# Patient Record
Sex: Male | Born: 1950 | Race: White | Hispanic: No | Marital: Married | Smoking: Never smoker
Health system: Southern US, Community
[De-identification: ages and names within clinical notes are randomized; demographics above are authoritative.]

## PROBLEM LIST (undated history)

## (undated) DIAGNOSIS — I700 Atherosclerosis of aorta: Secondary | ICD-10-CM

## (undated) DIAGNOSIS — I499 Cardiac arrhythmia, unspecified: Secondary | ICD-10-CM

## (undated) DIAGNOSIS — I10 Essential (primary) hypertension: Secondary | ICD-10-CM

## (undated) DIAGNOSIS — I38 Endocarditis, valve unspecified: Secondary | ICD-10-CM

## (undated) DIAGNOSIS — C801 Malignant (primary) neoplasm, unspecified: Secondary | ICD-10-CM

## (undated) DIAGNOSIS — C61 Malignant neoplasm of prostate: Secondary | ICD-10-CM

## (undated) DIAGNOSIS — Z7901 Long term (current) use of anticoagulants: Secondary | ICD-10-CM

## (undated) DIAGNOSIS — I2510 Atherosclerotic heart disease of native coronary artery without angina pectoris: Secondary | ICD-10-CM

## (undated) DIAGNOSIS — I4891 Unspecified atrial fibrillation: Secondary | ICD-10-CM

## (undated) DIAGNOSIS — I517 Cardiomegaly: Secondary | ICD-10-CM

## (undated) DIAGNOSIS — K769 Liver disease, unspecified: Secondary | ICD-10-CM

## (undated) DIAGNOSIS — K219 Gastro-esophageal reflux disease without esophagitis: Secondary | ICD-10-CM

---

## 2013-05-09 ENCOUNTER — Other Ambulatory Visit: Payer: Self-pay | Admitting: Orthopedic Surgery

## 2013-05-09 LAB — SYNOVIAL CELL COUNT + DIFF, W/ CRYSTALS
Eosinophil: 0 %
Lymphocytes: 47 %
Neutrophils: 5 %
Other Cells BF: 0 %
Other Mononuclear Cells: 48 %

## 2013-05-13 LAB — BODY FLUID CULTURE

## 2017-11-04 ENCOUNTER — Other Ambulatory Visit: Payer: Self-pay | Admitting: Cardiology

## 2017-11-10 ENCOUNTER — Ambulatory Visit
Admission: RE | Admit: 2017-11-10 | Discharge: 2017-11-10 | Disposition: A | Discharge status: Home / Self Care | Payer: BLUE CROSS/BLUE SHIELD | Source: Ambulatory Visit | Attending: Cardiology | Admitting: Cardiology

## 2017-11-10 ENCOUNTER — Ambulatory Visit: Payer: BLUE CROSS/BLUE SHIELD | Admitting: Anesthesiology

## 2017-11-10 ENCOUNTER — Encounter: Payer: Self-pay | Admitting: *Deleted

## 2017-11-10 ENCOUNTER — Encounter
Admission: RE | Disposition: A | Discharge status: Home / Self Care | Payer: Self-pay | Source: Ambulatory Visit | Attending: Cardiology

## 2017-11-10 DIAGNOSIS — G4733 Obstructive sleep apnea (adult) (pediatric): Secondary | ICD-10-CM | POA: Diagnosis not present

## 2017-11-10 DIAGNOSIS — Z7901 Long term (current) use of anticoagulants: Secondary | ICD-10-CM | POA: Diagnosis not present

## 2017-11-10 DIAGNOSIS — I4891 Unspecified atrial fibrillation: Secondary | ICD-10-CM | POA: Diagnosis present

## 2017-11-10 DIAGNOSIS — Z79899 Other long term (current) drug therapy: Secondary | ICD-10-CM | POA: Diagnosis not present

## 2017-11-10 HISTORY — DX: Unspecified atrial fibrillation: I48.91

## 2017-11-10 HISTORY — PX: CARDIOVERSION: EP1203

## 2017-11-10 SURGERY — CARDIOVERSION (CATH LAB)
Anesthesia: General

## 2017-11-10 MED ORDER — SODIUM CHLORIDE 0.9 % IV SOLN
INTRAVENOUS | Status: DC | PRN
  Administered 2017-11-10: 07:00:00 via INTRAVENOUS

## 2017-11-10 MED ORDER — PROPOFOL 10 MG/ML IV BOLUS
INTRAVENOUS | Status: AC
  Filled 2017-11-10: qty 20

## 2017-11-10 MED ORDER — DILTIAZEM HCL 25 MG/5ML IV SOLN
10.0000 mg | Freq: Once | INTRAVENOUS | Status: DC
  Administered 2017-11-10: 10 mg via INTRAVENOUS
  Filled 2017-11-10: qty 5

## 2017-11-10 MED ORDER — PROPOFOL 10 MG/ML IV BOLUS
INTRAVENOUS | Status: DC | PRN
  Administered 2017-11-10: 70 mg via INTRAVENOUS
  Administered 2017-11-10: 30 mg via INTRAVENOUS
  Administered 2017-11-10: 20 mg via INTRAVENOUS
  Administered 2017-11-10: 30 mg via INTRAVENOUS
  Administered 2017-11-10: 50 mg via INTRAVENOUS

## 2017-11-10 MED ORDER — DILTIAZEM LOAD VIA INFUSION
10.0000 mg | Freq: Once | INTRAVENOUS | Status: DC
  Filled 2017-11-10: qty 10

## 2017-11-10 NOTE — Anesthesia Preprocedure Evaluation (Signed)
Anesthesia Evaluation  Patient identified by MRN, date of birth, ID band Patient awake    Reviewed: Allergy & Precautions, H&P , NPO status , Patient's Chart, lab work & pertinent test results  History of Anesthesia Complications Negative for: history of anesthetic complications  Airway Mallampati: III  TM Distance: <3 FB Neck ROM: limited    Dental  (+) Chipped   Pulmonary neg pulmonary ROS, neg shortness of breath,           Cardiovascular Exercise Tolerance: Good (-) angina(-) Past MI and (-) DOE + dysrhythmias Atrial Fibrillation      Neuro/Psych negative neurological ROS  negative psych ROS   GI/Hepatic negative GI ROS, Neg liver ROS, neg GERD  ,  Endo/Other  negative endocrine ROS  Renal/GU negative Renal ROS  negative genitourinary   Musculoskeletal   Abdominal   Peds  Hematology negative hematology ROS (+)   Anesthesia Other Findings Past Medical History: No date: Atrial fibrillation ([place])  History reviewed. No pertinent surgical history.     Reproductive/Obstetrics negative OB ROS                             Anesthesia Physical Anesthesia Plan  ASA: III  Anesthesia Plan: General   Post-op Pain Management:    Induction: Intravenous  PONV Risk Score and Plan: Propofol infusion  Airway Management Planned: Natural Airway and Nasal Cannula  Additional Equipment:   Intra-op Plan:   Post-operative Plan:   Informed Consent: I have reviewed the patients History and Physical, chart, labs and discussed the procedure including the risks, benefits and alternatives for the proposed anesthesia with the patient or authorized representative who has indicated his/her understanding and acceptance.   Dental Advisory Given  Plan Discussed with: Anesthesiologist, CRNA and Surgeon  Anesthesia Plan Comments: (Patient consented for risks of anesthesia including but not limited to:   - adverse reactions to medications - risk of intubation if required - damage to teeth, lips or other oral mucosa - sore throat or hoarseness - Damage to heart, brain, lungs or loss of life  Patient voiced understanding.)        Anesthesia Quick Evaluation

## 2017-11-10 NOTE — Anesthesia Postprocedure Evaluation (Signed)
Anesthesia Post Note  Patient: Edwin [name]  Procedure(s) Performed: CARDIOVERSION (N/A )  Patient location during evaluation: Cath Lab Anesthesia Type: General Level of consciousness: awake and alert Pain management: pain level controlled Vital Signs Assessment: post-procedure vital signs reviewed and stable Respiratory status: spontaneous breathing, nonlabored ventilation, respiratory function stable and patient connected to nasal cannula oxygen Cardiovascular status: blood pressure returned to baseline and stable Postop Assessment: no apparent nausea or vomiting Anesthetic complications: no     Last Vitals:  Vitals:   11/10/17 0830 11/10/17 0845  BP: 140/90 135/88  Pulse: 97 (!) 102  Resp: 14 14  Temp:    SpO2: 98% 97%    Last Pain: There were no vitals filed for this visit.               Precious Haws Piscitello

## 2017-11-10 NOTE — Anesthesia Post-op Follow-up Note (Signed)
Anesthesia QCDR form completed.        

## 2017-11-10 NOTE — Transfer of Care (Signed)
Immediate Anesthesia Transfer of Care Note  Patient: Edwin [name]  Procedure(s) Performed: CARDIOVERSION (N/A )  Patient Location: spu  Anesthesia Type:General  Level of Consciousness: awake and alert   Airway & Oxygen Therapy: Patient Spontanous Breathing and Patient connected to nasal cannula oxygen  Post-op Assessment: Report given to RN and Post -op Vital signs reviewed and stable  Post vital signs: Reviewed  Last Vitals:  Vitals:   11/10/17 0750 11/10/17 0751  BP:  (!) 128/100  Pulse: (!) 129 (!) 130  Resp: 18 15  Temp:    SpO2: 94% 96%    Last Pain: There were no vitals filed for this visit.       Complications: No apparent anesthesia complications

## 2017-11-10 NOTE — Procedures (Signed)
Electrical Cardioversion Procedure Note Edwin [name] [id number] [birth year]  Procedure: Electrical Cardioversion Indications:  Atrial Fibrillation  Procedure Details Consent: Risks of procedure as well as the alternatives and risks of each were explained to the (patient/caregiver).  Consent for procedure obtained. Time Out: Verified patient identification, verified procedure, site/side was marked, verified correct patient position, special equipment/implants available, medications/allergies/relevent history reviewed, required imaging and test results available.  Performed  Patient placed on cardiac monitor, pulse oximetry, supplemental oxygen as necessary.  Sedation given: propofol Pacer pads placed anterior and posterior chest.  Cardioverted 3 time(s).  Cardioverted at [place].  Evaluation Findings: Post procedure EKG shows: Atrial Flutter Complications: None Patient did tolerate procedure well.   Teodoro Spray 11/10/2017, 8:05 AM

## 2017-11-26 NOTE — H&P (Signed)
Chief Complaint: Chief Complaint  Patient presents with  . 4 week follow up  afib  Date of Service: 10/29/2017 Date of Birth: 1951-07-10 PCP: Albina Billet, MD  History of Present Illness: Edwin [name] is a 66 y.o.male patient who is referred for evaluation after being noted to have probable atrial fibrillation on a physical exam. Patient was noted to get somewhat fatigued while hiking. This was new for him. His chads score is 1 for hypertension. His rate is well controlled. He has some daytime somnolence. He denies syncope. He denies chest pain. Patient was anticoagulated with apixaban and rate control with diltiazem. He remains in atrial fibrillation. He presents now for consideration for alternative therapy to consider cardioversion versus ablation. EKG today reveals atrial fibrillation at a rate of 112 with a QRS duration of 84 ms and a QTC of 458 ms. There is no ischemia.  Past Medical and Surgical History  Past Medical History History reviewed. No pertinent past medical history.  Past Surgical History He has no past surgical history on file.   Medications and Allergies  Current Medications  Current Outpatient Medications  Medication Sig Dispense Refill  . apixaban (ELIQUIS) 5 mg tablet Take 1 tablet (5 mg total) by mouth 2 (two) times daily 60 tablet 11  . diltiazem (CARDIZEM CD) 180 MG CD capsule Take 1 capsule (180 mg total) by mouth once daily 30 capsule 11  . doxazosin (CARDURA) 8 MG tablet Take one take daily 0  . losartan (COZAAR) 100 MG tablet Take 100 mg by mouth once daily. 5   No current facility-administered medications for this visit.   Allergies: Patient has no allergy information on record.  Social and Family History  Social History reports that he has never smoked. He has never used smokeless tobacco. He reports that he drinks about 25.2 oz of alcohol per week. He reports that he does not use drugs.  Family History History reviewed. No pertinent family  history.  Review of Systems  Review of Systems  Constitutional: Negative for chills, diaphoresis, fever, malaise/fatigue and weight loss.  HENT: Negative for congestion, ear discharge, hearing loss and tinnitus.  Eyes: Negative for blurred vision.  Respiratory: Negative for cough, hemoptysis, sputum production and wheezing.  Cardiovascular: Negative for chest pain, orthopnea, claudication, leg swelling and PND.  Gastrointestinal: Negative for abdominal pain, blood in stool, constipation, diarrhea, heartburn, melena, nausea and vomiting.  Genitourinary: Negative for dysuria, frequency, hematuria and urgency.  Musculoskeletal: Negative for back pain, falls, joint pain and myalgias.  Skin: Negative for itching and rash.  Neurological: Negative for dizziness, tingling, focal weakness, loss of consciousness, weakness and headaches.  Endo/Heme/Allergies: Negative for polydipsia. Does not bruise/bleed easily.  Psychiatric/Behavioral: Negative for depression, memory loss and substance abuse. The patient is not nervous/anxious.   Physical Examination   Vitals:BP 128/70  Resp 12  Ht 186.7 cm (6' 1.5")  Wt (!) 108 kg (238 lb)  BMI 30.97 kg/m  Ht:186.7 cm (6' 1.5") Wt:(!) 108 kg (238 lb) BTD:VVOH surface area is 2.37 meters squared. Body mass index is 30.97 kg/m.  Wt Readings from Last 3 Encounters:  10/29/17 (!) 108 kg (238 lb)  10/01/17 (!) 106.6 kg (235 lb)  09/09/17 (!) 105.7 kg (233 lb)   BP Readings from Last 3 Encounters:  10/29/17 128/70  10/01/17 130/84  09/09/17 142/80   General appearance appears in no acute distress  Head Mouth and Eye exam Normocephalic, without obvious abnormality, atraumatic Dentition is good Eyes appear anicteric  Neck exam Thyroid: normal  Nodes: no obvious adenopathy  LUNGS Breath Sounds: Normal Percussion: Normal  CARDIOVASCULAR JVP CV wave: no HJR: no Elevation at 90 degrees: None Carotid Pulse: normal pulsation  bilaterally Bruit: None Apex: apical impulse normal  Auscultation Rhythm: atrial fibrillation S1: normal S2: normal Clicks: no Rub: no Murmurs: no murmurs  Gallop: None ABDOMEN Liver enlargement: no Pulsatile aorta: no Ascites: no Bruits: no  EXTREMITIES Clubbing: no Edema: trace to 1+ bilateral pedal edema Pulses: peripheral pulses symmetrical Femoral Bruits: no Amputation: no SKIN Rash: no Cyanosis: no Embolic phemonenon: no Bruising: no NEURO Alert and Oriented to person, place and time: yes Non focal: yes  PSYCH: Pt appears to have normal affect  Diagnostic Studies Reviewed:  EKG EKG demonstrated nonspecific ST and T waves changes, atrial fibrillation, rate 112.  Assessment and Plan   66 y.o. male with  ICD-10-CM ICD-9-CM  1. Atrial fibrillation, unspecified type (CMS-[name])-rate appears to be fairly well controlled. Holter showed controlled rate. Chads score is 1. Will continue with Eliquis at 5 mg twice daily and continue with diltiazem at 180 mg daily. Benefits of cardioversion. Will schedule elective cardioversion with further recommendations after this is complete. I48.91 427.31      2. Obstructive sleep apnea syndrome-consideration for sleep study was raised however patient symptoms have improved. Will defer sleep study for now. G47.33 327.23   Return in about 1 week (around 11/05/2017).  These notes generated with voice recognition software. I apologize for typographical errors.  Edwin Levans, MD     Pt seen and examined. No change from above.

## 2018-05-26 ENCOUNTER — Other Ambulatory Visit: Payer: Self-pay | Admitting: Cardiology

## 2018-05-27 ENCOUNTER — Encounter
Admission: RE | Disposition: A | Discharge status: Home / Self Care | Payer: Self-pay | Source: Ambulatory Visit | Attending: Cardiology

## 2018-05-27 ENCOUNTER — Ambulatory Visit: Payer: Medicare Other | Admitting: Certified Registered Nurse Anesthetist

## 2018-05-27 ENCOUNTER — Encounter: Payer: Self-pay | Admitting: *Deleted

## 2018-05-27 ENCOUNTER — Ambulatory Visit
Admission: RE | Admit: 2018-05-27 | Discharge: 2018-05-27 | Disposition: A | Discharge status: Home / Self Care | Payer: Medicare Other | Source: Ambulatory Visit | Attending: Cardiology | Admitting: Cardiology

## 2018-05-27 DIAGNOSIS — Z7901 Long term (current) use of anticoagulants: Secondary | ICD-10-CM | POA: Diagnosis not present

## 2018-05-27 DIAGNOSIS — Z79899 Other long term (current) drug therapy: Secondary | ICD-10-CM | POA: Insufficient documentation

## 2018-05-27 DIAGNOSIS — G4733 Obstructive sleep apnea (adult) (pediatric): Secondary | ICD-10-CM | POA: Insufficient documentation

## 2018-05-27 DIAGNOSIS — I4891 Unspecified atrial fibrillation: Secondary | ICD-10-CM | POA: Insufficient documentation

## 2018-05-27 HISTORY — PX: CARDIOVERSION: EP1203

## 2018-05-27 SURGERY — CARDIOVERSION (CATH LAB)
Anesthesia: General

## 2018-05-27 MED ORDER — SODIUM CHLORIDE 0.9 % IV SOLN
250.0000 mL | INTRAVENOUS | Status: DC
  Administered 2018-05-27: 07:00:00 via INTRAVENOUS

## 2018-05-27 MED ORDER — SODIUM CHLORIDE 0.9% FLUSH: 3.0000 mL | INTRAVENOUS | Status: DC | PRN

## 2018-05-27 MED ORDER — PROPOFOL 10 MG/ML IV BOLUS
INTRAVENOUS | Status: DC | PRN
  Administered 2018-05-27 (×2): 30 mg via INTRAVENOUS
  Administered 2018-05-27: 10 mg via INTRAVENOUS
  Administered 2018-05-27: 60 mg via INTRAVENOUS

## 2018-05-27 MED ORDER — HYDROCORTISONE 1 % EX CREA
1.0000 "application " | TOPICAL_CREAM | Freq: Three times a day (TID) | CUTANEOUS | Status: DC | PRN
  Filled 2018-05-27: qty 28

## 2018-05-27 MED ORDER — EPHEDRINE SULFATE 50 MG/ML IJ SOLN
INTRAMUSCULAR | Status: AC
  Filled 2018-05-27: qty 1

## 2018-05-27 MED ORDER — SODIUM CHLORIDE 0.9% FLUSH: 3.0000 mL | Freq: Two times a day (BID) | INTRAVENOUS | Status: DC

## 2018-05-27 MED ORDER — PROPOFOL 10 MG/ML IV BOLUS
INTRAVENOUS | Status: AC
  Filled 2018-05-27: qty 20

## 2018-05-27 NOTE — Anesthesia Preprocedure Evaluation (Signed)
Anesthesia Evaluation  Patient identified by MRN, date of birth, ID band Patient awake    Reviewed: Allergy & Precautions, H&P , NPO status , Patient's Chart, lab work & pertinent test results  History of Anesthesia Complications Negative for: history of anesthetic complications  Airway Mallampati: III  TM Distance: <3 FB Neck ROM: limited    Dental  (+) Chipped   Pulmonary neg pulmonary ROS, neg shortness of breath,           Cardiovascular Exercise Tolerance: Good (-) angina(-) Past MI and (-) DOE + dysrhythmias Atrial Fibrillation      Neuro/Psych negative neurological ROS  negative psych ROS   GI/Hepatic negative GI ROS, Neg liver ROS, neg GERD  ,  Endo/Other  negative endocrine ROS  Renal/GU negative Renal ROS  negative genitourinary   Musculoskeletal   Abdominal   Peds  Hematology negative hematology ROS (+)   Anesthesia Other Findings Past Medical History: No date: Atrial fibrillation ([place])  History reviewed. No pertinent surgical history.     Reproductive/Obstetrics negative OB ROS                             Anesthesia Physical  Anesthesia Plan  ASA: III  Anesthesia Plan: General   Post-op Pain Management:    Induction: Intravenous  PONV Risk Score and Plan: Propofol infusion and TIVA  Airway Management Planned: Natural Airway and Nasal Cannula  Additional Equipment:   Intra-op Plan:   Post-operative Plan:   Informed Consent: I have reviewed the patients History and Physical, chart, labs and discussed the procedure including the risks, benefits and alternatives for the proposed anesthesia with the patient or authorized representative who has indicated his/her understanding and acceptance.   Dental Advisory Given  Plan Discussed with: Anesthesiologist, CRNA and Surgeon  Anesthesia Plan Comments: (Patient consented for risks of anesthesia including but not  limited to:  - adverse reactions to medications - risk of intubation if required - damage to teeth, lips or other oral mucosa - sore throat or hoarseness - Damage to heart, brain, lungs or loss of life  Patient voiced understanding.)        Anesthesia Quick Evaluation                                  Anesthesia Evaluation  Patient identified by MRN, date of birth, ID band Patient awake    Reviewed: Allergy & Precautions, H&P , NPO status , Patient's Chart, lab work & pertinent test results  History of Anesthesia Complications Negative for: history of anesthetic complications  Airway Mallampati: III  TM Distance: <3 FB Neck ROM: limited    Dental  (+) Chipped   Pulmonary neg pulmonary ROS, neg shortness of breath,           Cardiovascular Exercise Tolerance: Good (-) angina(-) Past MI and (-) DOE + dysrhythmias Atrial Fibrillation      Neuro/Psych negative neurological ROS  negative psych ROS   GI/Hepatic negative GI ROS, Neg liver ROS, neg GERD  ,  Endo/Other  negative endocrine ROS  Renal/GU negative Renal ROS  negative genitourinary   Musculoskeletal   Abdominal   Peds  Hematology negative hematology ROS (+)   Anesthesia Other Findings Past Medical History: No date: Atrial fibrillation ([place])  History reviewed. No pertinent surgical history.     Reproductive/Obstetrics negative OB ROS  Anesthesia Physical Anesthesia Plan  ASA: III  Anesthesia Plan: General   Post-op Pain Management:    Induction: Intravenous  PONV Risk Score and Plan: Propofol infusion  Airway Management Planned: Natural Airway and Nasal Cannula  Additional Equipment:   Intra-op Plan:   Post-operative Plan:   Informed Consent: I have reviewed the patients History and Physical, chart, labs and discussed the procedure including the risks, benefits and alternatives for the proposed anesthesia with  the patient or authorized representative who has indicated his/her understanding and acceptance.   Dental Advisory Given  Plan Discussed with: Anesthesiologist, CRNA and Surgeon  Anesthesia Plan Comments: (Patient consented for risks of anesthesia including but not limited to:  - adverse reactions to medications - risk of intubation if required - damage to teeth, lips or other oral mucosa - sore throat or hoarseness - Damage to heart, brain, lungs or loss of life  Patient voiced understanding.)        Anesthesia Quick Evaluation

## 2018-05-27 NOTE — Transfer of Care (Signed)
Immediate Anesthesia Transfer of Care Note  Patient: Edwin [name]  Procedure(s) Performed: CARDIOVERSION (N/A )  Patient Location: PACU  Anesthesia Type:General  Level of Consciousness: awake, alert  and oriented  Airway & Oxygen Therapy: Patient Spontanous Breathing and Patient connected to nasal cannula oxygen  Post-op Assessment: Report given to RN and Post -op Vital signs reviewed and stable  Post vital signs: Reviewed and stable  Last Vitals:  Vitals Value Taken Time  BP 132/82 05/27/2018  7:42 AM  Temp    Pulse 78 05/27/2018  7:43 AM  Resp 22 05/27/2018  7:43 AM  SpO2 93 % 05/27/2018  7:43 AM    Last Pain:  Vitals:   05/27/18 0646  PainSc: 0-No pain         Complications: No apparent anesthesia complications

## 2018-05-27 NOTE — Anesthesia Procedure Notes (Signed)
Performed by: Leshon Armistead, CRNA Pre-anesthesia Checklist: Patient identified, Emergency Drugs available, Suction available, Patient being monitored and Timeout performed Oxygen Delivery Method: Nasal cannula Induction Type: IV induction       

## 2018-05-27 NOTE — H&P (Signed)
Chief Complaint: Chief Complaint  Patient presents with  . Follow-up  Date of Service: 05/13/2018 Date of Birth: 1951/07/14 PCP: Albina Billet, MD  History of Present Illness: Mr. Edwin [name] is a 67 y.o.male patient who is referred for evaluation after being noted to have probable atrial fibrillation on a physical exam. Patient underwent cardioversion attempts with Cardizem and apixaban. He did not convert to sinus rhythm. He now returns for follow-up visit. Continues to have some weakness and fatigue. Patient is tolerating flecainide well but remains in A. fib. Will increase to 100 mg daily and reattempt cardioversion. He is back on Eliquis and doing well. Past Medical and Surgical History  Past Medical History History reviewed. No pertinent past medical history.  Past Surgical History He has no past surgical history on file.   Medications and Allergies  Current Medications  Current Outpatient Medications  Medication Sig Dispense Refill  . apixaban (ELIQUIS) 5 mg tablet Take 1 tablet (5 mg total) by mouth 2 (two) times daily 60 tablet 11  . calcium carbonate 500 mg calcium (1,250 mg) chewable tablet Take 500 mg of elemental by mouth 2 (two) times daily with meals  . diltiazem (CARDIZEM CD) 240 MG CD capsule Take 1 capsule (240 mg total) by mouth once daily 30 capsule 11  . doxazosin (CARDURA) 8 MG tablet Take one take daily 0  . flecainide (TAMBOCOR) 100 MG tablet Take 1 tablet (100 mg total) by mouth 2 (two) times daily 60 tablet 11  . losartan (COZAAR) 100 MG tablet Take 100 mg by mouth once daily. 5   No current facility-administered medications for this visit.   Allergies: Patient has no known allergies.  Social and Family History  Social History reports that he has never smoked. He has never used smokeless tobacco. He reports that he drinks about 25.2 oz of alcohol per week. He reports that he does not use drugs.  Family History History reviewed. No pertinent family  history.  Review of Systems  Review of Systems  Constitutional: Negative for chills, diaphoresis, fever, malaise/fatigue and weight loss.  HENT: Negative for congestion, ear discharge, hearing loss and tinnitus.  Eyes: Negative for blurred vision.  Respiratory: Negative for cough, hemoptysis, sputum production and wheezing.  Cardiovascular: Negative for chest pain, orthopnea, claudication, leg swelling and PND.  Gastrointestinal: Negative for abdominal pain, blood in stool, constipation, diarrhea, heartburn, melena, nausea and vomiting.  Genitourinary: Negative for dysuria, frequency, hematuria and urgency.  Musculoskeletal: Negative for back pain, falls, joint pain and myalgias.  Skin: Negative for itching and rash.  Neurological: Negative for dizziness, tingling, focal weakness, loss of consciousness, weakness and headaches.  Endo/Heme/Allergies: Negative for polydipsia. Does not bruise/bleed easily.  Psychiatric/Behavioral: Negative for depression, memory loss and substance abuse. The patient is not nervous/anxious.   Physical Examination   Vitals:BP 138/78  Pulse (!) 138  Ht 186.7 cm (6' 1.5")  Wt (!) 103.4 kg (228 lb)  BMI 29.67 kg/m  Ht:186.7 cm (6' 1.5") Wt:(!) 103.4 kg (228 lb) RWE:RXVQ surface area is 2.32 meters squared. Body mass index is 29.67 kg/m.  Wt Readings from Last 3 Encounters:  05/13/18 (!) 103.4 kg (228 lb)  11/18/17 (!) 106.6 kg (235 lb)  10/29/17 (!) 108 kg (238 lb)   BP Readings from Last 3 Encounters:  05/13/18 138/78  11/18/17 140/86  10/29/17 128/70   General appearance appears in no acute distress  Head Mouth and Eye exam Normocephalic, without obvious abnormality, atraumatic Dentition is good Eyes appear  anicteric   Neck exam Thyroid: normal  Nodes: no obvious adenopathy  LUNGS Breath Sounds: Normal Percussion: Normal  CARDIOVASCULAR JVP CV wave: no HJR: no Elevation at 90 degrees: None Carotid Pulse: normal pulsation  bilaterally Bruit: None Apex: apical impulse normal  Auscultation Rhythm: atrial fibrillation S1: normal S2: normal Clicks: no Rub: no Murmurs: no murmurs  Gallop: None ABDOMEN Liver enlargement: no Pulsatile aorta: no Ascites: no Bruits: no  EXTREMITIES Clubbing: no Edema: trace to 1+ bilateral pedal edema Pulses: peripheral pulses symmetrical Femoral Bruits: no Amputation: no SKIN Rash: no Cyanosis: no Embolic phemonenon: no Bruising: no NEURO Alert and Oriented to person, place and time: yes Non focal: yes  PSYCH: Pt appears to have normal affect  Diagnostic Studies Reviewed:  EKG EKG demonstrated nonspecific ST and T waves changes, atrial fibrillation, rate 112.  Assessment and Plan   67 y.o. male with  ICD-10-CM ICD-9-CM  1. Atrial fibrillation, unspecified type (CMS-[name])-rate appears to be fairly well controlled. Did not convert with cardioversion. Chads score is 1. Will continue with Eliquis at 5 mg twice daily and continue with diltiazem at 180 mg daily. We will increase flecainide to 100 mg twice daily and schedule cardioversion in 2 weeks. I48.91 427.31      2. Obstructive sleep apnea syndrome-consideration for sleep study was raised however patient symptoms have improved. Will defer sleep study for now. G47.33 327.23   Return in about 1 month (around 06/10/2018).  These notes generated with voice recognition software. I apologize for typographical errors.  Sydnee Levans, MD    Pt seen and examined. No change from above.

## 2018-05-27 NOTE — Anesthesia Postprocedure Evaluation (Signed)
Anesthesia Post Note  Patient: Edwin [name]  Procedure(s) Performed: CARDIOVERSION (N/A )  Patient location during evaluation: PACU Anesthesia Type: General Level of consciousness: awake and alert and oriented Pain management: pain level controlled Vital Signs Assessment: post-procedure vital signs reviewed and stable Respiratory status: spontaneous breathing Cardiovascular status: blood pressure returned to baseline Anesthetic complications: no     Last Vitals:  Vitals:   05/27/18 0815 05/27/18 0830  BP: 130/88 131/83  Pulse: 74 74  Resp: 15 16  Temp:    SpO2: 97% 97%    Last Pain:  Vitals:   05/27/18 0830  PainSc: 0-No pain                 Toiya Morrish

## 2018-05-27 NOTE — Procedures (Signed)
Electrical Cardioversion Procedure Note Edwin [name] [id number] 1950-12-13  Procedure: Electrical Cardioversion Indications:  Atrial Fibrillation  Procedure Details Consent: Risks of procedure as well as the alternatives and risks of each were explained to the (patient/caregiver).  Consent for procedure obtained. Time Out: Verified patient identification, verified procedure, site/side was marked, verified correct patient position, special equipment/implants available, medications/allergies/relevent history reviewed, required imaging and test results available.  Performed  Patient placed on cardiac monitor, pulse oximetry, supplemental oxygen as necessary.  Sedation given: Propofol Pacer pads placed anterior and posterior chest.  Cardioverted 1 time(s).  Cardioverted at 120J.  Evaluation Findings: Post procedure EKG shows: NSR Complications: None Patient did tolerate procedure well.   Teodoro Spray 05/27/2018, 7:45 AM

## 2018-05-27 NOTE — Anesthesia Post-op Follow-up Note (Signed)
Anesthesia QCDR form completed.        

## 2020-01-05 ENCOUNTER — Ambulatory Visit: Payer: Medicare Other | Attending: Internal Medicine

## 2020-01-05 DIAGNOSIS — Z23 Encounter for immunization: Secondary | ICD-10-CM | POA: Insufficient documentation

## 2020-01-05 NOTE — Progress Notes (Signed)
   Covid-19 Vaccination Clinic  Name:  Edwin [name]    MRN: [id number] DOB: 1951/06/09  01/05/2020  Edwin [name] was observed post Covid-19 immunization for 15 minutes without incidence. He was provided with Vaccine Information Sheet and instruction to access the V-Safe system.   Edwin [name] was instructed to call 911 with any severe reactions post vaccine: Marland Kitchen Difficulty breathing  . Swelling of your face and throat  . A fast heartbeat  . A bad rash all over your body  . Dizziness and weakness    Immunizations Administered    Name Date Dose VIS Date Route   Pfizer COVID-19 Vaccine 01/05/2020 12:10 PM 0.3 mL 11/10/2019 Intramuscular   Manufacturer: [place]   Lot: [id number]   [place]: [id number]

## 2020-01-30 ENCOUNTER — Ambulatory Visit: Payer: Medicare Other | Attending: Internal Medicine

## 2020-01-30 DIAGNOSIS — Z23 Encounter for immunization: Secondary | ICD-10-CM | POA: Insufficient documentation

## 2020-01-30 NOTE — Progress Notes (Signed)
   Covid-19 Vaccination Clinic  Name:  Edwin [name]    MRN: [id number] DOB: 09-07-1951  01/30/2020  Edwin [name] was observed post Covid-19 immunization for 15 minutes without incident. He was provided with Vaccine Information Sheet and instruction to access the V-Safe system.   Edwin [name] was instructed to call 911 with any severe reactions post vaccine: Marland Kitchen Difficulty breathing  . Swelling of face and throat  . A fast heartbeat  . A bad rash all over body  . Dizziness and weakness   Immunizations Administered    Name Date Dose VIS Date Route   Pfizer COVID-19 Vaccine 01/30/2020 11:41 AM 0.3 mL 11/10/2019 Intramuscular   Manufacturer: [place]   Lot: [id number]   [place]: [id number]

## 2020-03-17 ENCOUNTER — Emergency Department
Admission: EM | Admit: 2020-03-17 | Discharge: 2020-03-17 | Disposition: A | Discharge status: Home / Self Care | Payer: Medicare Other | Attending: Emergency Medicine | Admitting: Emergency Medicine

## 2020-03-17 ENCOUNTER — Emergency Department: Payer: Medicare Other

## 2020-03-17 ENCOUNTER — Other Ambulatory Visit: Payer: Self-pay

## 2020-03-17 ENCOUNTER — Encounter: Payer: Self-pay | Admitting: Emergency Medicine

## 2020-03-17 DIAGNOSIS — I10 Essential (primary) hypertension: Secondary | ICD-10-CM | POA: Diagnosis not present

## 2020-03-17 DIAGNOSIS — Z7901 Long term (current) use of anticoagulants: Secondary | ICD-10-CM | POA: Diagnosis not present

## 2020-03-17 DIAGNOSIS — R6883 Chills (without fever): Secondary | ICD-10-CM | POA: Insufficient documentation

## 2020-03-17 DIAGNOSIS — R42 Dizziness and giddiness: Secondary | ICD-10-CM | POA: Diagnosis present

## 2020-03-17 DIAGNOSIS — Z20822 Contact with and (suspected) exposure to covid-19: Secondary | ICD-10-CM | POA: Diagnosis not present

## 2020-03-17 DIAGNOSIS — I4891 Unspecified atrial fibrillation: Secondary | ICD-10-CM | POA: Diagnosis not present

## 2020-03-17 DIAGNOSIS — R5383 Other fatigue: Secondary | ICD-10-CM | POA: Insufficient documentation

## 2020-03-17 DIAGNOSIS — M7918 Myalgia, other site: Secondary | ICD-10-CM | POA: Diagnosis not present

## 2020-03-17 DIAGNOSIS — Z79899 Other long term (current) drug therapy: Secondary | ICD-10-CM | POA: Insufficient documentation

## 2020-03-17 HISTORY — DX: Essential (primary) hypertension: I10

## 2020-03-17 LAB — BASIC METABOLIC PANEL
Anion gap: 8 (ref 5–15)
BUN: 12 mg/dL (ref 8–23)
CO2: 26 mmol/L (ref 22–32)
Calcium: 9 mg/dL (ref 8.9–10.3)
Chloride: 94 mmol/L — ABNORMAL LOW (ref 98–111)
Creatinine, Ser: 1.2 mg/dL (ref 0.61–1.24)
GFR calc Af Amer: >60 mL/min (ref >60)
GFR calc non Af Amer: >60 mL/min (ref >60)
Glucose, Bld: 132 mg/dL — ABNORMAL HIGH (ref 70–99)
Potassium: 4 mmol/L (ref 3.5–5.1)
Sodium: 128 mmol/L — ABNORMAL LOW (ref 135–145)

## 2020-03-17 LAB — CBC
HCT: 36 % — ABNORMAL LOW (ref 39.0–52.0)
Hemoglobin: 12.5 g/dL — ABNORMAL LOW (ref 13.0–17.0)
MCH: 33.7 pg (ref 26.0–34.0)
MCHC: 34.7 g/dL (ref 30.0–36.0)
MCV: 97 fL (ref 80.0–100.0)
Platelets: 161 10*3/uL (ref 150–400)
RBC: 3.71 MIL/uL — ABNORMAL LOW (ref 4.22–5.81)
RDW: 12.1 % (ref 11.5–15.5)
WBC: 4.2 10*3/uL (ref 4.0–10.5)
nRBC: 0 % (ref 0.0–0.2)

## 2020-03-17 LAB — POC SARS CORONAVIRUS 2 AG: SARS Coronavirus 2 Ag: NEGATIVE

## 2020-03-17 MED ORDER — SODIUM CHLORIDE 0.9 % IV SOLN
1000.0000 mL | Freq: Once | INTRAVENOUS | Status: AC
  Administered 2020-03-17: 1000 mL via INTRAVENOUS

## 2020-03-17 MED ORDER — SODIUM CHLORIDE 0.9% FLUSH: 3.0000 mL | Freq: Once | INTRAVENOUS | Status: DC

## 2020-03-17 MED ORDER — KETOROLAC TROMETHAMINE 30 MG/ML IJ SOLN
15.0000 mg | Freq: Once | INTRAMUSCULAR | Status: AC
  Administered 2020-03-17: 20:00:00 15 mg via INTRAVENOUS
  Filled 2020-03-17: qty 1

## 2020-03-17 NOTE — ED Notes (Signed)
Given sprite.

## 2020-03-17 NOTE — ED Triage Notes (Signed)
Pt to ED via POV c/o dizziness. Pt states that about 1 hour PTA he was in the shower and fell. Pt denies LOC. Pt did not hit his head. Pt states that he started leaning and could not catch himself. Pt states that he has felt dizzy for the last 2 days. Pt states that he has also had decreased appetite. Pt denies sensation of the room spinning. Pt states that it is more of a lightheaded feeling. Pt states that he has had chills for 2-3 denies but denies fever at home.

## 2020-03-17 NOTE — ED Notes (Signed)
EDP in with patient 

## 2020-03-17 NOTE — ED Triage Notes (Signed)
FIRST NURSE NOTE- pt has had dizziness since yesterday and fell towards left side twice. Equal grip.  Still feeling dizzy.  Decreased appetite. NAD at this time.

## 2020-03-17 NOTE — ED Provider Notes (Signed)
[facility] Emergency Department Provider Note   ____________________________________________    I have reviewed the triage vital signs and the nursing notes.   HISTORY  Chief Complaint Dizziness     HPI Edwin [name] is a 69 y.o. male with a history of atrial fibrillation on Eliquis who presents with complaints of dizziness.  Patient reports that he was feeling lightheaded today, has felt fatigued as well as had body aches.  Does report chills is not sure if he has had a fever.  He was fully vaccinated last month against COVID-19.  No new medications.  Has not take anything for this.  Reports he is feeling improved at this time.  Denies shortness of breath.  No cough.  No chest pain or palpitations.  Has never felt this way before.  No headache.  No neuro deficits.  No nausea or vomiting  Past Medical History:  Diagnosis Date  . Atrial fibrillation ([place])   . Hypertension     There are no problems to display for this patient.   Past Surgical History:  Procedure Laterality Date  . CARDIOVERSION N/A 11/10/2017   Procedure: CARDIOVERSION;  Surgeon: Teodoro Spray, MD;  Location: [name];  Service: Cardiovascular;  Laterality: N/A;  . CARDIOVERSION N/A 05/27/2018   Procedure: CARDIOVERSION;  Surgeon: Teodoro Spray, MD;  Location: [name];  Service: Cardiovascular;  Laterality: N/A;    Prior to Admission medications   Medication Sig Start Date End Date Taking? Authorizing Provider  calcium carbonate (TUMS - DOSED IN MG ELEMENTAL CALCIUM) 500 MG chewable tablet Chew 1-2 tablets by mouth 2 (two) times daily as needed for indigestion or heartburn.    [provider]  diltiazem (CARDIZEM CD) 240 MG 24 hr capsule Take 240 mg by mouth daily.  10/27/17   [provider]  doxazosin (CARDURA) 8 MG tablet Take 8 mg by mouth every evening.    [provider]  ELIQUIS 5 MG TABS tablet Take 5 mg by mouth 2 (two) times daily. 10/29/17    [provider]  flecainide (TAMBOCOR) 100 MG tablet Take 100 mg by mouth 2 (two) times daily.    [provider]  losartan (COZAAR) 100 MG tablet Take 100 mg by mouth daily.    [provider]     Allergies Patient has no known allergies.  No family history on file.  Social History Social History   Tobacco Use  . Smoking status: Never Smoker  . Smokeless tobacco: Never Used  Substance Use Topics  . Alcohol use: Yes    Comment: 6-8 beers daily  . Drug use: No    Review of Systems  Constitutional: No fever/chills Eyes: No visual changes.  ENT: No sore throat. Cardiovascular: Denies chest pain. Respiratory: Denies shortness of breath. Gastrointestinal: No abdominal pain.  No nausea, no vomiting.   Genitourinary: Negative for dysuria. Musculoskeletal: Negative for back pain. Skin: Negative for rash. Neurological: Negative for headaches    ____________________________________________   PHYSICAL EXAM:  VITAL SIGNS: ED Triage Vitals [03/17/20 1619]  Enc Vitals Group     BP (!) 110/50     Pulse Rate 60     Resp 16     Temp 100.3 F (37.9 C)     Temp Source Oral     SpO2 97 %     Weight 93.4 kg (206 lb)     Height 1.854 m (6\' 1" )     Head Circumference  Peak Flow      Pain Score 0     Pain Loc      Pain Edu?      Excl. in [place]?     Constitutional: Alert and oriented. No acute distress.   Nose: No congestion/rhinnorhea. Mouth/Throat: Mucous membranes are moist.    Cardiovascular: Normal rate, regular rhythm. Grossly normal heart sounds.  Good peripheral circulation. Respiratory: Normal respiratory effort.  No retractions. Lungs CTAB. Gastrointestinal: Soft and nontender. No distention.   Genitourinary: deferred Musculoskeletal: No lower extremity tenderness nor edema.  Warm and well perfused Neurologic:  Normal speech and language. No gross focal neurologic deficits are appreciated.  Ambulating without difficulty.  No neuro  deficits noted Skin:  Skin is warm, dry and intact. No rash noted. Psychiatric: Mood and affect are normal. Speech and behavior are normal.  ____________________________________________   LABS (all labs ordered are listed, but only abnormal results are displayed)  Labs Reviewed  BASIC METABOLIC PANEL - Abnormal; Notable for the following components:      Result Value   Sodium 128 (*)    Chloride 94 (*)    Glucose, Bld 132 (*)    All other components within normal limits  CBC - Abnormal; Notable for the following components:   RBC 3.71 (*)    Hemoglobin 12.5 (*)    HCT 36.0 (*)    All other components within normal limits  SARS CORONAVIRUS 2 (TAT 6-24 HRS)  URINALYSIS, COMPLETE (UACMP) WITH MICROSCOPIC  POC SARS CORONAVIRUS 2 AG -  ED  POC SARS CORONAVIRUS 2 AG   ____________________________________________  EKG  ED ECG REPORT I, Lavonia Drafts, the attending physician, personally viewed and interpreted this ECG.  Date: 03/17/2020  Rhythm: normal sinus rhythm QRS Axis: normal Intervals: normal ST/T Wave abnormalities: normal Narrative Interpretation: no evidence of acute ischemia  ____________________________________________  RADIOLOGY  Chest x-ray viewed by me, unremarkable ____________________________________________   PROCEDURES  Procedure(s) performed: No  Procedures   Critical Care performed: No ____________________________________________   INITIAL IMPRESSION / ASSESSMENT AND PLAN / ED COURSE  Pertinent labs & imaging results that were available during my care of the patient were reviewed by me and considered in my medical decision making (see chart for details).  Patient presents with complaints of lightheadedness today, now significantly improved.  Does report chills as well as body aches.  No loss of taste or smell but decreased appetite.  Has not had much to eat in over a day.  No sick contacts reported.  No nausea or vomiting.  No neuro  deficits or headache.  No palpitations or chest pain to suggest arrhythmia.  No cough or shortness of breath.  Symptoms are suspicious for possible infection, possible viral illness given elevated temperature of 100.3 description of myalgias fatigue, decreased appetite and dizziness.  Low likelihood of COVID-19 infection given that he is vaccinated but certainly possibility.  Other viral illness is a possibility.  Chest x-ray is reassuring does not demonstrate pneumonia.  Have swabbed the patient for COVID-19, rapid antigen swab was negative, PCR was sent.  Lab work is overall quite reassuring.  Patient felt much better after IV fluids and IV Toradol.  Ambulated well without dizziness.  He will follow-up closely with his PCP, understands that if any changes need to return to the emergency department.  Discussed with he and his wife.  They are in agreement with the plan.    ____________________________________________   FINAL CLINICAL IMPRESSION(S) / ED DIAGNOSES  Final diagnoses:  Dizziness        Note:  This document was prepared using Dragon voice recognition software and may include unintentional dictation errors.   Lavonia Drafts, MD 03/17/20 2330

## 2020-03-18 LAB — SARS CORONAVIRUS 2 (TAT 6-24 HRS): SARS Coronavirus 2: NEGATIVE

## 2020-04-08 ENCOUNTER — Other Ambulatory Visit: Payer: Self-pay | Admitting: Urology

## 2020-04-08 DIAGNOSIS — R972 Elevated prostate specific antigen [PSA]: Secondary | ICD-10-CM

## 2020-04-18 ENCOUNTER — Ambulatory Visit: Payer: Medicare Other

## 2020-04-24 ENCOUNTER — Other Ambulatory Visit: Payer: Self-pay

## 2020-04-24 ENCOUNTER — Ambulatory Visit
Admission: RE | Admit: 2020-04-24 | Discharge: 2020-04-24 | Disposition: A | Discharge status: Home / Self Care | Payer: Medicare Other | Source: Ambulatory Visit | Attending: Urology | Admitting: Urology

## 2020-04-24 DIAGNOSIS — R972 Elevated prostate specific antigen [PSA]: Secondary | ICD-10-CM | POA: Diagnosis present

## 2020-04-24 MED ORDER — GADOBUTROL 1 MMOL/ML IV SOLN
10.0000 mL | Freq: Once | INTRAVENOUS | Status: AC | PRN
  Administered 2020-04-24: 10 mL via INTRAVENOUS

## 2020-05-23 NOTE — H&P (Signed)
NAME: Edwin [name], Edwin [name] MEDICAL RECORD [id number] ACCOUNT [id number] DATE OF BIRTH:16-Aug-1951 FACILITY: [facility] LOCATION:  PHYSICIAN:Lilybelle Mayeda R. Cedricka Sackrider, MD  HISTORY AND PHYSICAL  DATE OF ADMISSION:  06/06/2020  CHIEF COMPLAINT:  Elevated PSA.  HISTORY OF PRESENT ILLNESS:  The patient is a 69 year old white male with a history of elevated PSA of 5.7 ng/mL.  This was evaluated with exosome study indicating an IntelliScore of 87.71, which was above the cutoff for higher risk of high-grade  prostate cancer.  He also underwent prostate MRI scan, which indicated an 11 mm PI-RAD category lesion of the left side of the prostate and a 13 mm PI-RAD category lesion of the left base.  He comes in now for image-guided fusion biopsy of the prostate  with the UroNav device.  PAST MEDICAL HISTORY:    ALLERGIES:  No drug allergies.  CURRENT MEDICATIONS:  Eliquis, calcium carbonate, Cardizem-CD, Cardura, Tambocor, Cozaar, Toprol-XL.  PAST SURGICAL HISTORY:  Cardioversion 2018 and 2019.  PAST AND CURRENT MEDICAL CONDITIONS: 1.  Atrial fibrillation. 2.  Hypertension.  REVIEW OF SYSTEMS:  The patient denies chest pain, shortness of breath, diabetes or stroke.  SOCIAL HISTORY:  The patient denied tobacco use.  Consumes 40 alcoholic beverages per week.  FAMILY HISTORY:  Father died at age 38 of a cerebral hemorrhage and hypertension.  Mother died at age 28 with dementia.  Brother died at age 84 of heart disease.  There is no family history of prostate cancer.  PHYSICAL EXAMINATION: VITAL SIGNS:  Height 5 feet 11 inches, weight 202 pounds, BMI 28. GENERAL:  Well-nourished, white male in no acute distress. HEENT:  Sclerae were clear.  Pupils are equally round, reactive to light and accommodation.  Extraocular movements are intact. NECK:  No palpable masses or tenderness. LYMPHADENOPATHY:  No palpable inguinal or cervical adenopathy. PULMONARY:  Lungs clear to auscultation. CARDIOVASCULAR:  Regular  rhythm without audible murmurs. ABDOMEN:  Soft, nontender abdomen.  No CVA tenderness. GENITOURINARY:  He was circumcised.  Testes were smooth, nontender, 20 cc in size each. RECTAL:  40 g, smooth, nontender prostate. NEUROMUSCULAR:  Alert and oriented x3.  IMPRESSION: 1.  Elevated PSA. 2.  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 3.  Elevated exosome IntelliScore.  PLAN:  Proceed with UroNav fusion biopsy of the prostate.  CN/NUANCE  D:05/22/2020 T:05/22/2020 JOB:[id number]

## 2020-05-29 ENCOUNTER — Other Ambulatory Visit
Admission: RE | Admit: 2020-05-29 | Discharge: 2020-05-29 | Disposition: A | Discharge status: Home / Self Care | Payer: Medicare Other | Source: Ambulatory Visit | Attending: Urology | Admitting: Urology

## 2020-05-29 ENCOUNTER — Other Ambulatory Visit: Payer: Self-pay

## 2020-05-29 NOTE — Pre-Procedure Instructions (Signed)
Copied from ED note   EKG  ED ECG REPORT I, Lavonia Drafts, the attending physician, personally viewed and interpreted this ECG.  Date: 03/17/2020  Rhythm: normal sinus rhythm QRS Axis: normal Intervals: normal ST/T Wave abnormalities: normal Narrative Interpretation: no evidence of acute ischemia

## 2020-05-29 NOTE — Patient Instructions (Signed)
Your procedure is scheduled on: 06/06/20 Report to [place]. To find out your arrival time please call [phone] between 1PM - 3PM on 06/05/20.  Remember: Instructions that are not followed completely may result in serious medical risk, up to and including death, or upon the discretion of your surgeon and anesthesiologist your surgery may need to be rescheduled.     _X__ 1. Do not eat food after midnight the night before your procedure.                 No gum chewing or hard candies. You may drink clear liquids up to 2 hours                 before you are scheduled to arrive for your surgery- DO not drink clear                 liquids within 2 hours of the start of your surgery.                 Clear Liquids include:  water, apple juice without pulp, clear carbohydrate                 drink such as Clearfast or Gatorade, Black Coffee or Tea (Do not add                 anything to coffee or tea). Diabetics water only  __X__2.  On the morning of surgery brush your teeth with toothpaste and water, you                 may rinse your mouth with mouthwash if you wish.  Do not swallow any              toothpaste of mouthwash.     _X__ 3.  No Alcohol for 24 hours before or after surgery.   _X__ 4.  Do Not Smoke or use e-cigarettes For 24 Hours Prior to Your Surgery.                 Do not use any chewable tobacco products for at least 6 hours prior to                 surgery.  ____  5.  Bring all medications with you on the day of surgery if instructed.   __X__  6.  Notify your doctor if there is any change in your medical condition      (cold, fever, infections).     Do not wear jewelry, make-up, hairpins, clips or nail polish. Do not wear lotions, powders, or perfumes.  Do not shave 48 hours prior to surgery. Men may shave face and neck. Do not bring valuables to the hospital.    [facility] is not responsible for any belongings or  valuables.  Contacts, dentures/partials or body piercings may not be worn into surgery. Bring a case for your contacts, glasses or hearing aids, a denture cup will be supplied. Leave your suitcase in the car. After surgery it may be brought to your room. For patients admitted to the hospital, discharge time is determined by your treatment team.   Patients discharged the day of surgery will not be allowed to drive home.   Please read over the following fact sheets that you were given:   MRSA Information  __X__ Take these medicines the morning of surgery with A SIP OF WATER:  1. diltiazem (CARDIZEM CD) 120 MG 24 hr capsule  2. flecainide (TAMBOCOR) 100 MG tablet  3.   4.  5.  6.  __X__ Fleet Enema (as directed) morning of procedure  ____ Use CHG Soap/SAGE wipes as directed  ____ Use inhalers on the day of surgery  ____ Stop metformin/Janumet/Farxiga 2 days prior to surgery    ____ Take 1/2 of usual insulin dose the night before surgery. No insulin the morning          of surgery.   __X__ Stop Blood Thinners Coumadin/Plavix/Xarelto/Pleta/Pradaxa/Eliquis/Effient/Aspirin  on   Or contact your Surgeon, Cardiologist or Medical Doctor regarding  ability to stop your blood thinners  __X__ Stop Anti-inflammatories 7 days before surgery such as Advil, Ibuprofen, Motrin,  BC or Goodies Powder, Naprosyn, Naproxen, Aleve, Aspirin    __X__ Stop all herbal supplements, fish oil or vitamin E until after surgery.    ____ Bring C-Pap to the hospital.      Indwelling Urinary Catheter Care, Adult An indwelling urinary catheter is a thin, flexible, germ-free (sterile) tube that is placed into the bladder to help drain urine out of the body. The catheter is inserted into the part of the body that drains urine from the bladder (urethra). Urine drains from the catheter into a drainage bag outside of the body. Taking good care of your catheter will keep it working properly and help to prevent  problems from developing. What are the risks?  Bacteria may get into your bladder and cause a urinary tract infection.  Urine flow can become blocked. This can happen if the catheter is not working correctly, or if you have sediment or a blood clot in your bladder or the catheter.  Tissue near the catheter may become irritated and bleed. How to wear your catheter and your drainage bag Supplies needed  Adhesive tape or a leg strap.  Alcohol wipe or soap and water (if you use tape).  A clean towel (if you use tape).  Overnight drainage bag.  Smaller drainage bag (leg bag). Wearing your catheter and bag Use adhesive tape or a leg strap to attach your catheter to your leg.  Make sure the catheter is not pulled tight.  If a leg strap gets wet, replace it with a dry one.  If you use adhesive tape: 1. Use an alcohol wipe or soap and water to wash off any stickiness on your skin where you had tape before. 2. Use a clean towel to pat-dry the area. 3. Apply the new tape. You should have received a large overnight drainage bag and a smaller leg bag that fits underneath clothing.  You may wear the overnight bag at any time, but you should not wear the leg bag at night.  Always wear the leg bag below your knee.  Make sure the overnight drainage bag is always lower than the level of your bladder, but do not let it touch the floor. Before you go to sleep, hang the bag inside a wastebasket that is covered by a clean plastic bag. How to care for your skin around the catheter     Supplies needed  A clean washcloth.  Water and mild soap.  A clean towel. Caring for your skin and catheter  Every day, use a clean washcloth and soapy water to clean the skin around your catheter. 1. Wash your hands with soap and water. 2. Wet a washcloth in warm water and mild soap. 3. Clean the skin around your  urethra.  If you are male:  Use one hand to gently spread the folds of skin around  your vagina (labia).  With the washcloth in your other hand, wipe the inner side of your labia on each side. Do this in a front-to-back direction.  If you are male:  Use one hand to pull back any skin that covers the end of your penis (foreskin).  With the washcloth in your other hand, wipe your penis in small circles. Start wiping at the tip of your penis, then move outward from the catheter.  Move the foreskin back in place, if this applies. 4. With your free hand, hold the catheter close to where it enters your body. Keep holding the catheter during cleaning so it does not get pulled out. 5. Use your other hand to clean the catheter with the washcloth.  Only wipe downward on the catheter.  Do not wipe upward toward your body, because that may push bacteria into your urethra and cause infection. 6. Use a clean towel to pat-dry the catheter and the skin around it. Make sure to wipe off all soap. 7. Wash your hands with soap and water.  Shower every day. Do not take baths.  Do not use cream, ointment, or lotion on the area where the catheter enters your body, unless your health care provider tells you to do that.  Do not use powders, sprays, or lotions on your genital area.  Check your skin around the catheter every day for signs of infection. Check for: ? Redness, swelling, or pain. ? Fluid or blood. ? Warmth. ? Pus or a bad smell. How to empty the drainage bag Supplies needed  Rubbing alcohol.  Gauze pad or cotton ball.  Adhesive tape or a leg strap. Emptying the bag Empty your drainage bag (your overnight drainage bag or your leg bag) when it is ?- full, or at least 2-3 times a day. Clean the drainage bag according to the manufacturer's instructions or as told by your health care provider. 1. Wash your hands with soap and water. 2. Detach the drainage bag from your leg. 3. Hold the drainage bag over the toilet or a clean container. Make sure the drainage bag is lower  than your hips and bladder. This stops urine from going back into the tubing and into your bladder. 4. Open the pour spout at the bottom of the bag. 5. Empty the urine into the toilet or container. Do not let the pour spout touch any surface. This precaution is important to prevent bacteria from getting in the bag and causing infection. 6. Apply rubbing alcohol to a gauze pad or cotton ball. 7. Use the gauze pad or cotton ball to clean the pour spout. 8. Close the pour spout. 9. Attach the bag to your leg with adhesive tape or a leg strap. 10. Wash your hands with soap and water. How to change the drainage bag Supplies needed:  Alcohol wipes.  A clean drainage bag.  Adhesive tape or a leg strap. Changing the bag Replace your drainage bag with a clean bag if it leaks, starts to smell bad, or looks dirty. 1. Wash your hands with soap and water. 2. Detach the dirty drainage bag from your leg. 3. Pinch the catheter with your fingers so that urine does not spill out. 4. Disconnect the catheter tube from the drainage tube at the connection valve. Do not let the tubes touch any surface. 5. Clean the end of the catheter  tube with an alcohol wipe. Use a different alcohol wipe to clean the end of the drainage tube. 6. Connect the catheter tube to the drainage tube of the clean bag. 7. Attach the clean bag to your leg with adhesive tape or a leg strap. Avoid attaching the new bag too tightly. 8. Wash your hands with soap and water. General instructions   Never pull on your catheter or try to remove it. Pulling can damage your internal tissues.  Always wash your hands before and after you handle your catheter or drainage bag. Use a mild, fragrance-free soap. If soap and water are not available, use hand sanitizer.  Always make sure there are no twists or bends (kinks) in the catheter tube.  Always make sure there are no leaks in the catheter or drainage bag.  Drink enough fluid to keep your  urine pale yellow.  Do not take baths, swim, or use a hot tub.  If you are male, wipe from front to back after having a bowel movement. Contact a health care provider if:  Your urine is cloudy.  Your urine smells unusually bad.  Your catheter gets clogged.  Your catheter starts to leak.  Your bladder feels full. Get help right away if:  You have redness, swelling, or pain where the catheter enters your body.  You have fluid, blood, pus, or a bad smell coming from the area where the catheter enters your body.  The area where the catheter enters your body feels warm to the touch.  You have a fever.  You have pain in your abdomen, legs, lower back, or bladder.  You see blood in the catheter.  Your urine is pink or red.  You have nausea, vomiting, or chills.  Your urine is not draining into the bag.  Your catheter gets pulled out. Summary  An indwelling urinary catheter is a thin, flexible, germ-free (sterile) tube that is placed into the bladder to help drain urine out of the body.  The catheter is inserted into the part of the body that drains urine from the bladder (urethra).  Take good care of your catheter to keep it working properly and help prevent problems from developing.  Always wash your hands before and after you handle your catheter or drainage bag.  Never pull on your catheter or try to remove it. This information is not intended to replace advice given to you by your health care provider. Make sure you discuss any questions you have with your health care provider. Document Revised: 03/10/2019 Document Reviewed: 07/02/2017 Elsevier Patient Education  [place].

## 2020-06-04 ENCOUNTER — Other Ambulatory Visit
Admission: RE | Admit: 2020-06-04 | Discharge: 2020-06-04 | Disposition: A | Discharge status: Home / Self Care | Payer: Medicare Other | Source: Ambulatory Visit | Attending: Urology | Admitting: Urology

## 2020-06-04 ENCOUNTER — Other Ambulatory Visit: Payer: Self-pay

## 2020-06-04 DIAGNOSIS — Z01812 Encounter for preprocedural laboratory examination: Secondary | ICD-10-CM | POA: Insufficient documentation

## 2020-06-04 DIAGNOSIS — Z20822 Contact with and (suspected) exposure to covid-19: Secondary | ICD-10-CM | POA: Diagnosis not present

## 2020-06-04 LAB — SARS CORONAVIRUS 2 (TAT 6-24 HRS): SARS Coronavirus 2: NEGATIVE

## 2020-06-04 LAB — BASIC METABOLIC PANEL
Anion gap: 8 (ref 5–15)
BUN: 10 mg/dL (ref 8–23)
CO2: 29 mmol/L (ref 22–32)
Calcium: 9.7 mg/dL (ref 8.9–10.3)
Chloride: 96 mmol/L — ABNORMAL LOW (ref 98–111)
Creatinine, Ser: 1.01 mg/dL (ref 0.61–1.24)
GFR calc Af Amer: >60 mL/min (ref >60)
GFR calc non Af Amer: >60 mL/min (ref >60)
Glucose, Bld: 100 mg/dL — ABNORMAL HIGH (ref 70–99)
Potassium: 4 mmol/L (ref 3.5–5.1)
Sodium: 133 mmol/L — ABNORMAL LOW (ref 135–145)

## 2020-06-05 MED ORDER — GENTAMICIN IN SALINE 1.6-0.9 MG/ML-% IV SOLN
80.0000 mg | INTRAVENOUS | Status: AC
  Administered 2020-06-06: 80 mg via INTRAVENOUS
  Filled 2020-06-05: qty 50

## 2020-06-06 ENCOUNTER — Ambulatory Visit: Payer: Medicare Other | Admitting: Anesthesiology

## 2020-06-06 ENCOUNTER — Other Ambulatory Visit: Payer: Self-pay

## 2020-06-06 ENCOUNTER — Encounter: Payer: Self-pay | Admitting: Urology

## 2020-06-06 ENCOUNTER — Encounter
Admission: RE | Disposition: A | Discharge status: Home / Self Care | Payer: Self-pay | Source: Home / Self Care | Attending: Urology

## 2020-06-06 ENCOUNTER — Ambulatory Visit
Admission: RE | Admit: 2020-06-06 | Discharge: 2020-06-06 | Disposition: A | Discharge status: Home / Self Care | Payer: Medicare Other | Attending: Urology | Admitting: Urology

## 2020-06-06 DIAGNOSIS — R972 Elevated prostate specific antigen [PSA]: Secondary | ICD-10-CM | POA: Diagnosis present

## 2020-06-06 DIAGNOSIS — C61 Malignant neoplasm of prostate: Secondary | ICD-10-CM | POA: Diagnosis not present

## 2020-06-06 DIAGNOSIS — Z888 Allergy status to other drugs, medicaments and biological substances status: Secondary | ICD-10-CM | POA: Diagnosis not present

## 2020-06-06 DIAGNOSIS — I10 Essential (primary) hypertension: Secondary | ICD-10-CM | POA: Diagnosis not present

## 2020-06-06 HISTORY — PX: PROSTATE BIOPSY: SHX241

## 2020-06-06 SURGERY — BIOPSY, PROSTATE
Anesthesia: General

## 2020-06-06 MED ORDER — DEXAMETHASONE SODIUM PHOSPHATE 10 MG/ML IJ SOLN
INTRAMUSCULAR | Status: DC | PRN
  Administered 2020-06-06: 10 mg via INTRAVENOUS

## 2020-06-06 MED ORDER — MIDAZOLAM HCL 2 MG/2ML IJ SOLN
INTRAMUSCULAR | Status: AC
  Filled 2020-06-06: qty 2

## 2020-06-06 MED ORDER — CEFAZOLIN SODIUM-DEXTROSE 1-4 GM/50ML-% IV SOLN
INTRAVENOUS | Status: AC
  Filled 2020-06-06: qty 50

## 2020-06-06 MED ORDER — CEFAZOLIN SODIUM-DEXTROSE 1-4 GM/50ML-% IV SOLN
1.0000 g | Freq: Once | INTRAVENOUS | Status: AC
  Administered 2020-06-06: 1 g via INTRAVENOUS

## 2020-06-06 MED ORDER — LIDOCAINE HCL (PF) 2 % IJ SOLN
INTRAMUSCULAR | Status: AC
  Filled 2020-06-06: qty 5

## 2020-06-06 MED ORDER — EPHEDRINE SULFATE 50 MG/ML IJ SOLN
INTRAMUSCULAR | Status: DC | PRN
  Administered 2020-06-06 (×2): 25 mg via INTRAVENOUS

## 2020-06-06 MED ORDER — GLYCOPYRROLATE 0.2 MG/ML IJ SOLN
INTRAMUSCULAR | Status: DC | PRN
  Administered 2020-06-06: .2 mg via INTRAVENOUS

## 2020-06-06 MED ORDER — LIDOCAINE HCL (CARDIAC) PF 100 MG/5ML IV SOSY
PREFILLED_SYRINGE | INTRAVENOUS | Status: DC | PRN
  Administered 2020-06-06: 100 mg via INTRAVENOUS

## 2020-06-06 MED ORDER — FAMOTIDINE 20 MG PO TABS: 20.0000 mg | ORAL_TABLET | Freq: Once | ORAL | Status: AC

## 2020-06-06 MED ORDER — MIDAZOLAM HCL 2 MG/2ML IJ SOLN
INTRAMUSCULAR | Status: DC | PRN
  Administered 2020-06-06: 2 mg via INTRAVENOUS

## 2020-06-06 MED ORDER — LEVOFLOXACIN 500 MG PO TABS
500.0000 mg | ORAL_TABLET | Freq: Every day | ORAL | 0 refills | Status: DC
Start: 2020-06-06 — End: 2021-08-20

## 2020-06-06 MED ORDER — ONDANSETRON HCL 4 MG/2ML IJ SOLN
INTRAMUSCULAR | Status: AC
  Filled 2020-06-06: qty 2

## 2020-06-06 MED ORDER — OXYCODONE HCL 5 MG/5ML PO SOLN: 5.0000 mg | Freq: Once | ORAL | Status: DC | PRN

## 2020-06-06 MED ORDER — LACTATED RINGERS IV SOLN: INTRAVENOUS | Status: DC

## 2020-06-06 MED ORDER — ONDANSETRON HCL 4 MG/2ML IJ SOLN
INTRAMUSCULAR | Status: DC | PRN
  Administered 2020-06-06: 4 mg via INTRAVENOUS

## 2020-06-06 MED ORDER — FENTANYL CITRATE (PF) 100 MCG/2ML IJ SOLN
INTRAMUSCULAR | Status: DC | PRN
  Administered 2020-06-06: 100 ug via INTRAVENOUS

## 2020-06-06 MED ORDER — CHLORHEXIDINE GLUCONATE 0.12 % MT SOLN: 15.0000 mL | Freq: Once | OROMUCOSAL | Status: AC

## 2020-06-06 MED ORDER — ORAL CARE MOUTH RINSE: 15.0000 mL | Freq: Once | OROMUCOSAL | Status: AC

## 2020-06-06 MED ORDER — FAMOTIDINE 20 MG PO TABS
ORAL_TABLET | ORAL | Status: AC
  Administered 2020-06-06: 20 mg via ORAL
  Filled 2020-06-06: qty 1

## 2020-06-06 MED ORDER — FENTANYL CITRATE (PF) 100 MCG/2ML IJ SOLN
INTRAMUSCULAR | Status: AC
  Filled 2020-06-06: qty 2

## 2020-06-06 MED ORDER — FENTANYL CITRATE (PF) 100 MCG/2ML IJ SOLN: 25.0000 ug | INTRAMUSCULAR | Status: DC | PRN

## 2020-06-06 MED ORDER — DEXAMETHASONE SODIUM PHOSPHATE 10 MG/ML IJ SOLN
INTRAMUSCULAR | Status: AC
  Filled 2020-06-06: qty 1

## 2020-06-06 MED ORDER — OXYCODONE HCL 5 MG PO TABS: 5.0000 mg | ORAL_TABLET | Freq: Once | ORAL | Status: DC | PRN

## 2020-06-06 MED ORDER — SUGAMMADEX SODIUM 200 MG/2ML IV SOLN
INTRAVENOUS | Status: DC | PRN
  Administered 2020-06-06: 200 mg via INTRAVENOUS

## 2020-06-06 MED ORDER — ROCURONIUM BROMIDE 100 MG/10ML IV SOLN
INTRAVENOUS | Status: DC | PRN
  Administered 2020-06-06: 50 mg via INTRAVENOUS

## 2020-06-06 MED ORDER — PROPOFOL 10 MG/ML IV BOLUS
INTRAVENOUS | Status: AC
  Filled 2020-06-06: qty 40

## 2020-06-06 MED ORDER — GLYCOPYRROLATE 0.2 MG/ML IJ SOLN
INTRAMUSCULAR | Status: AC
  Filled 2020-06-06: qty 1

## 2020-06-06 MED ORDER — CHLORHEXIDINE GLUCONATE 0.12 % MT SOLN
OROMUCOSAL | Status: AC
  Administered 2020-06-06: 15 mL via OROMUCOSAL
  Filled 2020-06-06: qty 15

## 2020-06-06 MED ORDER — PROPOFOL 10 MG/ML IV BOLUS
INTRAVENOUS | Status: DC | PRN
  Administered 2020-06-06: 150 mg via INTRAVENOUS

## 2020-06-06 SURGICAL SUPPLY — 10 items
COVER MAYO STAND REUSABLE (DRAPES) ×3 IMPLANT
COVER WAND RF STERILE (DRAPES) ×3 IMPLANT
GLOVE BIO SURGEON STRL SZ7 (GLOVE) ×6 IMPLANT
GUIDE NDL ENDOCAV 16-18 CVR (NEEDLE) IMPLANT
GUIDE NEEDLE ENDOCAV 16-18 CVR (NEEDLE) IMPLANT
INST BIOPSY MAXCORE 18GX25 (NEEDLE) ×3 IMPLANT
NDL GUIDE BIOPSY 644068 (NEEDLE) IMPLANT
NEEDLE GUIDE BIOPSY 644068 (NEEDLE) IMPLANT
SURGILUBE 2OZ TUBE FLIPTOP (MISCELLANEOUS) ×3 IMPLANT
TOWEL OR 17X26 4PK STRL BLUE (TOWEL DISPOSABLE) ×3 IMPLANT

## 2020-06-06 NOTE — Discharge Instructions (Addendum)
Transrectal Ultrasound-Guided Prostate Biopsy, Care After This sheet gives you information about how to care for yourself after your procedure. Your doctor may also give you more specific instructions. If you have problems or questions, contact your doctor. What can I expect after the procedure? After the procedure, it is common to have:  Pain and discomfort in your butt, especially while sitting.  Pink-colored pee (urine), due to small amounts of blood in the pee.  Burning while peeing (urinating).  Blood in your poop (stool).  Bleeding from your butt.  Blood in your semen. Follow these instructions at home: Medicines  Take over-the-counter and prescription medicines only as told by your doctor.  If you were prescribed antibiotic medicine, take it as told by your doctor. Do not stop taking the antibiotic even if you start to feel better. Activity   Do not drive for 24 hours if you were given a medicine to help you relax (sedative) during your procedure.  Return to your normal activities as told by your doctor. Ask your doctor what activities are safe for you.  Ask your doctor when it is okay for you to have sex.  Do not lift anything that is heavier than 10 lb (4.5 kg), or the limit that you are told, until your doctor says that it is safe. General instructions   Drink enough water to keep your pee pale yellow.  Watch your pee, poop, and semen for new bleeding or bleeding that gets worse.  Keep all follow-up visits as told by your doctor. This is important. Contact a doctor if you:  Have blood clots in your pee or poop.  Notice that your pee smells bad or unusual.  Have very bad belly pain.  Have trouble peeing.  Notice that your lower belly feels firm.  Have blood in your pee for more than 2 weeks after the procedure.  Have blood in your semen for more than 2 months after the procedure.  Have problems getting an erection.  Feel sick to your stomach  (nauseous).  Throw up (vomit).  Have new or worse bleeding in your pee, poop, or semen. Get help right away if you:  Have a fever or chills.  Have bright red pee.  Have very bad pain that does not get better with medicine.  Cannot pee. Summary  After this procedure, it is common to have pain and discomfort around your butt, especially while sitting.  You may have blood in your pee and poop.  It is common to have blood in your semen for 1-2 months.  If you were prescribed antibiotic medicine, take it as told by your doctor. Do not stop taking the antibiotic even if you start to feel better.  Get help right away if you have a fever or chills. This information is not intended to replace advice given to you by your health care provider. Make sure you discuss any questions you have with your health care provider. Document Revised: 03/08/2019 Document Reviewed: 09/14/2017 Elsevier Patient Education  2020 [employer] Inc[employer]   AMBULATORY SURGERY  DISCHARGE INSTRUCTIONS   1) The drugs that you were given will stay in your system until tomorrow so for the next 24 hours you should not:  A) Drive an automobile B) Make any legal decisions C) Drink any alcoholic beverage   2) You may resume regular meals tomorrow.  Today it is better to start with liquids and gradually work up to solid foods.  You may eat anything you prefer,   but it is better to start with liquids, then soup and crackers, and gradually work up to solid foods.   3) Please notify your doctor immediately if you have any unusual bleeding, trouble breathing, redness and pain at the surgery site, drainage, fever, or pain not relieved by medication.    4) Additional Instructions:        Please contact your physician with any problems or Same Day Surgery at [phone], Monday through Friday 6 am to 4 pm, or [place] [facility] number at [phone]. 

## 2020-06-06 NOTE — Transfer of Care (Signed)
Immediate Anesthesia Transfer of Care Note  Patient: [name]  Procedure(s) Performed: PROSTATE BIOPSY URONAV (N/A )  Patient Location: PACU  Anesthesia Type:General  Level of Consciousness: sedated and patient cooperative  Airway & Oxygen Therapy: Patient Spontanous Breathing and Patient connected to face mask oxygen  Post-op Assessment: Report given to RN and Post -op Vital signs reviewed and stable  Post vital signs: Reviewed and stable  Last Vitals:  Vitals Value Taken Time  BP 117/69 06/06/20 0810  Temp    Pulse 58 06/06/20 0812  Resp 11 06/06/20 0812  SpO2 100 % 06/06/20 0812  Vitals shown include unvalidated device data.  Last Pain:  Vitals:   06/06/20 0811  TempSrc:   PainSc: (P) Asleep      Patients Stated Pain Goal: 0 (55/73/22 0254)  Complications: No complications documented.

## 2020-06-06 NOTE — H&P (Signed)
Date of Initial H&P: 05/23/20  History reviewed, patient examined, no change in status, stable for surgery.

## 2020-06-06 NOTE — Op Note (Signed)
Preoperative diagnosis:   1.elevated PSA (R97.2)                                              2.  Abnormal prostate MRI  Postoperative diagnosis: Same   Procedure: Uronav fusion transrectal ultrasound biopsy of the prostate (CPT [id number],                       [phone], [id number])  Surgeon: Otelia Limes. Yves Dill MD  Anesthesia: General  Indications:See the history and physical. After informed consent the above procedure(s) were requested     Technique and findings: After adequate general anesthesia had been obtained the patient was placed into left lateral decubitus position.  Finger sweep of the rectal vault was performed and the vault was clear.  The ultrasound probe was then placed into the rectal vault and the sound images of the prostate were acquired.  Images were then fused with the MRI images.  Region of interest #1 was identified and 3 core biopsies were taken.  Region of interest #2 was identified and 3 core biopsies taken here.  At this point, standard 12 core systematic biopsies were performed.  The ultrasound probe was then removed.  Blood loss was minimal.  Procedure was then terminated and patient transferred to the recovery room in stable condition.

## 2020-06-06 NOTE — Anesthesia Procedure Notes (Signed)
Procedure Name: Intubation Performed by: Gaynelle Cage, CRNA Pre-anesthesia Checklist: Patient identified, Emergency Drugs available, Suction available and Patient being monitored Patient Re-evaluated:Patient Re-evaluated prior to induction Oxygen Delivery Method: Circle system utilized Preoxygenation: Pre-oxygenation with 100% oxygen Induction Type: IV induction Ventilation: Mask ventilation without difficulty and Oral airway inserted - appropriate to patient size Laryngoscope Size: Mac and 3 Grade View: Grade II Tube type: Oral Tube size: 7.5 mm Number of attempts: 1 Airway Equipment and Method: Oral airway Placement Confirmation: ETT inserted through vocal cords under direct vision,  positive ETCO2 and breath sounds checked- equal and bilateral Secured at: 22 cm Tube secured with: Tape Dental Injury: Teeth and Oropharynx as per pre-operative assessment

## 2020-06-06 NOTE — Anesthesia Postprocedure Evaluation (Signed)
Anesthesia Post Note  Patient: Edwin [name]  Procedure(s) Performed: PROSTATE BIOPSY URONAV (N/A )  Patient location during evaluation: PACU Anesthesia Type: General Level of consciousness: awake and alert Pain management: pain level controlled Vital Signs Assessment: post-procedure vital signs reviewed and stable Respiratory status: spontaneous breathing, nonlabored ventilation, respiratory function stable and patient connected to nasal cannula oxygen Cardiovascular status: blood pressure returned to baseline and stable Postop Assessment: no apparent nausea or vomiting Anesthetic complications: no   No complications documented.   Last Vitals:  Vitals:   06/06/20 0826 06/06/20 0840  BP: 117/70 121/71  Pulse: (!) 56 (!) 55  Resp: 12 11  Temp:  36.6 C  SpO2: 96% 96%    Last Pain:  Vitals:   06/06/20 0840  TempSrc:   PainSc: 0-No pain                 Precious Haws Keniah Klemmer

## 2020-06-06 NOTE — Anesthesia Preprocedure Evaluation (Addendum)
Anesthesia Evaluation  Patient identified by MRN, date of birth, ID band Patient awake    Reviewed: Allergy & Precautions, H&P , NPO status , Patient's Chart, lab work & pertinent test results  History of Anesthesia Complications Negative for: history of anesthetic complications  Airway Mallampati: III  TM Distance: <3 FB Neck ROM: limited    Dental  (+) Chipped, Poor Dentition   Pulmonary neg pulmonary ROS, neg shortness of breath,    Pulmonary exam normal        Cardiovascular Exercise Tolerance: Good hypertension, + dysrhythmias Atrial Fibrillation      Neuro/Psych negative neurological ROS  negative psych ROS   GI/Hepatic negative GI ROS, Neg liver ROS, neg GERD  ,  Endo/Other  negative endocrine ROS  Renal/GU      Musculoskeletal   Abdominal   Peds  Hematology negative hematology ROS (+)   Anesthesia Other Findings Past Medical History: No date: Atrial fibrillation ([place]) No date: Hypertension  Past Surgical History: 11/10/2017: CARDIOVERSION; N/A     Comment:  Procedure: CARDIOVERSION;  Surgeon: Teodoro Spray, MD;              Location: [name];  Service: Cardiovascular;                Laterality: N/A; 05/27/2018: CARDIOVERSION; N/A     Comment:  Procedure: CARDIOVERSION;  Surgeon: Teodoro Spray, MD;              Location: [name];  Service: Cardiovascular;                Laterality: N/A;  BMI    Body Mass Index: 26.91 kg/m      Reproductive/Obstetrics negative OB ROS                             Anesthesia Physical Anesthesia Plan  ASA: III  Anesthesia Plan: General ETT   Post-op Pain Management:    Induction: Intravenous  PONV Risk Score and Plan: Dexamethasone, Ondansetron, Midazolam and Treatment may vary due to age or medical condition  Airway Management Planned: Oral ETT  Additional Equipment:   Intra-op Plan:   Post-operative Plan: Extubation in  OR  Informed Consent: I have reviewed the patients History and Physical, chart, labs and discussed the procedure including the risks, benefits and alternatives for the proposed anesthesia with the patient or authorized representative who has indicated his/her understanding and acceptance.     Dental Advisory Given  Plan Discussed with: Anesthesiologist, CRNA and Surgeon  Anesthesia Plan Comments: (Patient consented for risks of anesthesia including but not limited to:  - adverse reactions to medications - damage to eyes, teeth, lips or other oral mucosa - nerve damage due to positioning  - sore throat or hoarseness - Damage to heart, brain, nerves, lungs, other parts of body or loss of life  Patient voiced understanding.)       Anesthesia Quick Evaluation

## 2020-06-07 ENCOUNTER — Encounter: Payer: Self-pay | Admitting: Urology

## 2020-06-07 LAB — SURGICAL PATHOLOGY

## 2021-06-23 ENCOUNTER — Other Ambulatory Visit: Payer: Self-pay | Admitting: Urology

## 2021-06-23 ENCOUNTER — Other Ambulatory Visit (HOSPITAL_COMMUNITY): Payer: Self-pay | Admitting: Urology

## 2021-06-23 DIAGNOSIS — C61 Malignant neoplasm of prostate: Secondary | ICD-10-CM

## 2021-07-02 ENCOUNTER — Ambulatory Visit
Admission: RE | Admit: 2021-07-02 | Discharge: 2021-07-02 | Disposition: A | Discharge status: Home / Self Care | Payer: Medicare Other | Source: Ambulatory Visit | Attending: Urology | Admitting: Urology

## 2021-07-02 ENCOUNTER — Other Ambulatory Visit: Payer: Self-pay

## 2021-07-02 DIAGNOSIS — C61 Malignant neoplasm of prostate: Secondary | ICD-10-CM | POA: Diagnosis present

## 2021-07-02 MED ORDER — GADOBUTROL 1 MMOL/ML IV SOLN
9.0000 mL | Freq: Once | INTRAVENOUS | Status: AC | PRN
  Administered 2021-07-02: 9 mL via INTRAVENOUS

## 2021-07-08 ENCOUNTER — Other Ambulatory Visit: Payer: Self-pay | Admitting: Urology

## 2021-07-08 DIAGNOSIS — K6389 Other specified diseases of intestine: Secondary | ICD-10-CM

## 2021-07-10 ENCOUNTER — Other Ambulatory Visit: Payer: Self-pay

## 2021-07-10 ENCOUNTER — Ambulatory Visit
Admission: RE | Admit: 2021-07-10 | Discharge: 2021-07-10 | Disposition: A | Discharge status: Home / Self Care | Payer: Medicare Other | Source: Ambulatory Visit | Attending: Urology | Admitting: Urology

## 2021-07-10 DIAGNOSIS — K6389 Other specified diseases of intestine: Secondary | ICD-10-CM | POA: Diagnosis not present

## 2021-07-10 LAB — POCT I-STAT CREATININE: Creatinine, Ser: 1 mg/dL (ref 0.61–1.24)

## 2021-07-10 MED ORDER — IOHEXOL 350 MG/ML SOLN
100.0000 mL | Freq: Once | INTRAVENOUS | Status: AC | PRN
  Administered 2021-07-10: 100 mL via INTRAVENOUS

## 2021-07-15 ENCOUNTER — Other Ambulatory Visit: Payer: Self-pay | Admitting: General Surgery

## 2021-07-15 NOTE — Progress Notes (Signed)
Subjective:     Patient ID: Edwin [name] is a 70 y.o. male.   HPI   The following portions of the patient's history were reviewed and updated as appropriate.   This a new patient is here today for: office visit. The patient has been referred today by Dr. Maryan Puls for evaluation of a sigmoid mass. The patient had a CT abdomen/pelvis done on 07-10-21 at [place]. Patient reports he has not had a colonoscopy in the past. He thinks that his mom did have colon cancer around age 108-80 but is unsure.  She had some surgical procedure, but no additional treatments that he was aware of.  She died in her late 75s.  The patient states about once a month he has noticed bright red blood in the toilet bowl after a bowel movement. He thought this may be due to taking Eliquis. He reports he moves his bowels twice per day.         Chief Complaint  Patient presents with   sigmoid mass      BP 138/88   Pulse 61   Temp 36.7 C (98.1 F)   Ht 185.4 cm ('6\' 1"'$ )   Wt 87.5 kg (193 lb)   SpO2 98%   BMI 25.46 kg/m        Past Medical History:  Diagnosis Date   Atrial fibrillation, persistent (CMS-[name])     HTN (hypertension)             Past Surgical History:  Procedure Laterality Date   BIOPSY PROSTATE INCISIONAL   06/06/2020   CARDIOVERSION EXTERNAL   05/27/2018   CARDIOVERSION EXTERNAL   11/10/2017          Social History           Socioeconomic History   Marital status: Married  Tobacco Use   Smoking status: Never Smoker   Smokeless tobacco: Never Used  Scientific laboratory technician Use: Never used  Substance and Sexual Activity   Alcohol use: Yes      Alcohol/week: 42.0 standard drinks      Types: 42 Cans of beer per week      Comment: drinks 6 beers a day   Drug use: No        No Known Allergies   Current Medications        Current Outpatient Medications  Medication Sig Dispense Refill   calcium carbonate 500 mg calcium (1,250 mg) chewable tablet Take  500 mg of elemental by mouth 2 (two) times daily with meals       diltiazem (CARDIZEM CD) 120 MG XR capsule TAKE 1 CAPSULE BY MOUTH ONCE DAILY 90 capsule 3   doxazosin (CARDURA) 4 MG tablet Take 1 tablet (4 mg total) by mouth nightly Take one take daily 90 tablet 2   ELIQUIS 5 mg tablet TAKE 1 TABLET BY MOUTH TWICE A DAY 180 tablet 3   flecainide (TAMBOCOR) 100 MG tablet TAKE 1 TABLET BY MOUTH TWICE A DAY 180 tablet 3   losartan (COZAAR) 50 MG tablet TAKE 1 TABLET EVERY DAY 90 tablet 3   metoprolol succinate (TOPROL-XL) 25 MG XL tablet TAKE 1 TABLET BY MOUTH EVERY DAY 90 tablet 3   losartan (COZAAR) 100 MG tablet Take 0.5 tablets (50 mg total) by mouth once daily (Patient not taking: No sig reported) 30 tablet 5    No current facility-administered medications for this visit.        No family  history on file.       Review of Systems  Constitutional: Negative for chills and fever.  Respiratory: Negative for cough.          Objective:   Physical Exam Constitutional:      Appearance: Normal appearance.  Cardiovascular:     Rate and Rhythm: Normal rate and regular rhythm.     Pulses: Normal pulses.     Heart sounds: Normal heart sounds.  Pulmonary:     Effort: Pulmonary effort is normal.     Breath sounds: Normal breath sounds.  Abdominal:     General: Abdomen is flat. Bowel sounds are normal.     Tenderness: There is no abdominal tenderness.  Lymphadenopathy:     Upper Body:     Left upper body: No supraclavicular adenopathy.     Lower Body: No right inguinal adenopathy. No left inguinal adenopathy.  Neurological:     Mental Status: He is alert and oriented to person, place, and time.  Psychiatric:        Mood and Affect: Mood normal.        Behavior: Behavior normal.        Labs and Radiology:    MRI report of July 02, 2021 was reviewed.   CT scan of the abdomen and pelvis dated July 10, 2021 was independently reviewed.   1. The colon is very redundant,  particularly the sigmoid colon, and there is a fungating endoluminal mass at the left aspect of the sigmoid colon, approximately 20-25 cm from the anal verge, measuring at least 3.7 x 2.7 cm. Findings are consistent with primary colon malignancy. 2. No definite evidence of lymphadenopathy or metastatic disease in the abdomen or pelvis. 3. There are two small enhancing lesions of the anterior right lobe of the liver measuring 1.0 cm and 0.8 cm. These most likely reflect small flash filling hemangiomata, hepatic metastases not favored although not strictly excluded. Due to small size, these may be technically difficult to characterize by multiphasic MR. Attention on follow-up.   Review of the images suggest this may be a pedunculated polyp.        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   Prostate cancer under clinical observation.   Weight loss over 3 years, likely dietary based on history and absence of intra-abdominal metastatic disease on CT imaging.    Plan:     Indications for colonoscopy were reviewed to confirm the MRI and CT findings and potentially complete polypectomy if possible.  Risks of the procedure i were reviewed.   The patient desired a late in the day appointment and for this reason the procedure is presently scheduled for August 06, 2021.   Patient to be scheduled for a colonoscopy at a convenient date.    This note is partially prepared by Ledell Noss, CMA acting as a scribe in the presence of Dr. Hervey Ard, MD.    The documentation recorded by the scribe accurately reflects the service I personally performed and the decisions made by me.    Robert Bellow, MD FACS

## 2021-08-05 ENCOUNTER — Encounter: Payer: Self-pay | Admitting: General Surgery

## 2021-08-06 ENCOUNTER — Other Ambulatory Visit: Payer: Self-pay

## 2021-08-06 ENCOUNTER — Encounter: Payer: Self-pay | Admitting: General Surgery

## 2021-08-06 ENCOUNTER — Ambulatory Visit
Admission: RE | Admit: 2021-08-06 | Discharge: 2021-08-06 | Disposition: A | Discharge status: Home / Self Care | Payer: Medicare Other | Source: Ambulatory Visit | Attending: General Surgery | Admitting: General Surgery

## 2021-08-06 ENCOUNTER — Ambulatory Visit: Payer: Medicare Other | Admitting: Anesthesiology

## 2021-08-06 ENCOUNTER — Encounter
Admission: RE | Disposition: A | Discharge status: Home / Self Care | Payer: Self-pay | Source: Ambulatory Visit | Attending: General Surgery

## 2021-08-06 DIAGNOSIS — C189 Malignant neoplasm of colon, unspecified: Secondary | ICD-10-CM

## 2021-08-06 DIAGNOSIS — Z79899 Other long term (current) drug therapy: Secondary | ICD-10-CM | POA: Insufficient documentation

## 2021-08-06 DIAGNOSIS — C61 Malignant neoplasm of prostate: Secondary | ICD-10-CM | POA: Diagnosis not present

## 2021-08-06 DIAGNOSIS — C187 Malignant neoplasm of sigmoid colon: Secondary | ICD-10-CM | POA: Diagnosis not present

## 2021-08-06 DIAGNOSIS — Z7901 Long term (current) use of anticoagulants: Secondary | ICD-10-CM | POA: Diagnosis not present

## 2021-08-06 DIAGNOSIS — R933 Abnormal findings on diagnostic imaging of other parts of digestive tract: Secondary | ICD-10-CM | POA: Diagnosis present

## 2021-08-06 HISTORY — DX: Malignant neoplasm of colon, unspecified: C18.9

## 2021-08-06 HISTORY — PX: COLONOSCOPY WITH PROPOFOL: SHX5780

## 2021-08-06 SURGERY — COLONOSCOPY WITH PROPOFOL
Anesthesia: General

## 2021-08-06 MED ORDER — SPOT INK MARKER SYRINGE KIT
PACK | SUBMUCOSAL | Status: DC | PRN
  Administered 2021-08-06: 3 mL via SUBMUCOSAL

## 2021-08-06 MED ORDER — EPHEDRINE SULFATE 50 MG/ML IJ SOLN
INTRAMUSCULAR | Status: DC | PRN
  Administered 2021-08-06 (×2): 10 mg via INTRAVENOUS

## 2021-08-06 MED ORDER — GLYCOPYRROLATE 0.2 MG/ML IJ SOLN
INTRAMUSCULAR | Status: DC | PRN
  Administered 2021-08-06: .2 mg via INTRAVENOUS

## 2021-08-06 MED ORDER — SODIUM CHLORIDE 0.9 % IV SOLN: INTRAVENOUS | Status: DC

## 2021-08-06 MED ORDER — PROPOFOL 10 MG/ML IV BOLUS
INTRAVENOUS | Status: AC
  Filled 2021-08-06: qty 20

## 2021-08-06 MED ORDER — ELIQUIS 5 MG PO TABS: 5.0000 mg | ORAL_TABLET | Freq: Two times a day (BID) | ORAL | 11 refills | Status: DC

## 2021-08-06 MED ORDER — PROPOFOL 500 MG/50ML IV EMUL
INTRAVENOUS | Status: DC | PRN
  Administered 2021-08-06: 150 ug/kg/min via INTRAVENOUS

## 2021-08-06 MED ORDER — GLYCOPYRROLATE 0.2 MG/ML IJ SOLN
INTRAMUSCULAR | Status: AC
  Filled 2021-08-06: qty 1

## 2021-08-06 MED ORDER — PROPOFOL 500 MG/50ML IV EMUL
INTRAVENOUS | Status: AC
  Filled 2021-08-06: qty 50

## 2021-08-06 MED ORDER — EPHEDRINE 5 MG/ML INJ
INTRAVENOUS | Status: AC
  Filled 2021-08-06: qty 5

## 2021-08-06 NOTE — Transfer of Care (Signed)
Immediate Anesthesia Transfer of Care Note  Patient: Edwin [name]  Procedure(s) Performed: COLONOSCOPY WITH PROPOFOL  Patient Location: PACU  Anesthesia Type:General  Level of Consciousness: awake and sedated  Airway & Oxygen Therapy: Patient Spontanous Breathing and Patient connected to nasal cannula oxygen  Post-op Assessment: Report given to RN  Post vital signs: Reviewed and stable  Last Vitals:  Vitals Value Taken Time  BP    Temp    Pulse    Resp    SpO2      Last Pain:  Vitals:   08/06/21 1037  TempSrc: Temporal  PainSc: 0-No pain         Complications: No notable events documented.

## 2021-08-06 NOTE — H&P (Signed)
Edwin [name] [id number] 14-Mar-1951     HPI:  70 y/o male had a MRI for prostate cancer assessment with identification of a sigmoid colon mass.  For colonoscopy.   Medications Prior to Admission  Medication Sig Dispense Refill Last Dose   calcium carbonate (TUMS - DOSED IN MG ELEMENTAL CALCIUM) 500 MG chewable tablet Chew 1 tablet by mouth daily.   08/04/2021   diltiazem (CARDIZEM CD) 120 MG 24 hr capsule Take 120 mg by mouth daily.   11 08/05/2021   doxazosin (CARDURA) 8 MG tablet Take 8 mg by mouth every evening.   08/05/2021   flecainide (TAMBOCOR) 100 MG tablet Take 100 mg by mouth 2 (two) times daily.   08/05/2021 at 2100   losartan (COZAAR) 50 MG tablet Take 50 mg by mouth daily.    08/05/2021 at 2100   metoprolol succinate (TOPROL-XL) 25 MG 24 hr tablet Take 25 mg by mouth at bedtime.    08/05/2021 at 2100   ELIQUIS 5 MG TABS tablet Take 5 mg by mouth 2 (two) times daily.  11 08/02/2021   levofloxacin (LEVAQUIN) 500 MG tablet Take 1 tablet (500 mg total) by mouth daily. (Patient not taking: Reported on 08/06/2021) 7 tablet 0 Completed Course   No Known Allergies Past Medical History:  Diagnosis Date   Atrial fibrillation ([place])    Hypertension    Past Surgical History:  Procedure Laterality Date   CARDIOVERSION N/A 11/10/2017   Procedure: CARDIOVERSION;  Surgeon: Teodoro Spray, MD;  Location: [place] [facility];  Service: Cardiovascular;  Laterality: N/A;   CARDIOVERSION N/A 05/27/2018   Procedure: CARDIOVERSION;  Surgeon: Teodoro Spray, MD;  Location: [place] [facility];  Service: Cardiovascular;  Laterality: N/A;   PROSTATE BIOPSY N/A 06/06/2020   Procedure: PROSTATE BIOPSY [name];  Surgeon: Royston Cowper, MD;  Location: [name];  Service: Urology;  Laterality: N/A;   Social History   Socioeconomic History   Marital status: Married    Spouse name: Not on file   Number of children: Not on file   Years of education: Not on file   Highest education level: Not on file  Occupational History   Not on  file  Tobacco Use   Smoking status: Never   Smokeless tobacco: Never  Vaping Use   Vaping Use: Never used  Substance and Sexual Activity   Alcohol use: Yes    Comment: 6-8 beers daily   Drug use: No   Sexual activity: Not on file  Other Topics Concern   Not on file  Social History Narrative   Not on file   Social Determinants of Health   Financial Resource Strain: Not on file  Food Insecurity: Not on file  Transportation Needs: Not on file  Physical Activity: Not on file  Stress: Not on file  Social Connections: Not on file  Intimate Partner Violence: Not on file   Social History   Social History Narrative   Not on file     ROS: Negative.     PE: HEENT: Negative. Lungs: Clear. Cardio: RR.    Assessment/Plan:  Proceed with planned endoscopy.    Forest Gleason [facility] 08/06/2021

## 2021-08-06 NOTE — Op Note (Signed)
[facility] Gastroenterology Patient Name: Edwin [name] Procedure Date: 08/06/2021 10:58 AM MRN: [id number] Account #: [id number] Date of Birth: 09-25-1951 Admit Type: Outpatient Age: 70 Room: [facility] ENDO ROOM 1 Gender: Male Note Status: Finalized Instrument Name: Peds Colonoscope [id number] Procedure:             Colonoscopy Indications:           Abnormal CT of the GI tract Providers:             Robert Bellow, MD Referring MD:          Leona Carry. Hall Busing, MD (Referring MD) Medicines:             Propofol per Anesthesia Complications:         No immediate complications. Procedure:             Pre-Anesthesia Assessment:                        - Prior to the procedure, a History and Physical was                         performed, and patient medications, allergies and                         sensitivities were reviewed. The patient's tolerance                         of previous anesthesia was reviewed.                        - The risks and benefits of the procedure and the                         sedation options and risks were discussed with the                         patient. All questions were answered and informed                         consent was obtained.                        After obtaining informed consent, the colonoscope was                         passed under direct vision. Throughout the procedure,                         the patient's blood pressure, pulse, and oxygen                         saturations were monitored continuously. The                         Colonoscope was introduced through the anus and                         advanced to the the cecum, identified by appendiceal  orifice and ileocecal valve. The colonoscopy was                         technically difficult and complex due to poor                         endoscopic visualization, a redundant colon and                         significant looping. Successful  completion of the                         procedure was aided by using manual pressure. The                         patient tolerated the procedure well. The quality of                         the bowel preparation was adequate to identify polyps                         6 mm and larger in size.                        Residue made full visualization of the descending                         colon difficult. Findings:      A sessile non-obstructing large mass was found in the colon. The mass       was partially circumferential (involving one-third of the lumen       circumference). The mass measured two cm in length. In addition, its       diameter measured fifteen mm. No bleeding was present. The polyp was       removed with a hot snare. Polyp resection was incomplete. The resected       tissue was retrieved.      The retroflexed view of the distal rectum and anal verge was normal and       showed no anal or rectal abnormalities. Area was successfully injected       with 3 mL [place] ink for tattooing. Impression:            - Malignant tumor. Tissue was removed.                        - The distal rectum and anal verge are normal on                         retroflexion view. Recommendation:        - Telephone endoscopist for pathology results in 1                         week. Procedure Code(s):     --- Professional ---                        [phone], Colonoscopy, flexible; with removal of  tumor(s), polyp(s), or other lesion(s) by snare                         technique                        [id number], Colonoscopy, flexible; with directed submucosal                         injection(s), any substance Diagnosis Code(s):     --- Professional ---                        C18.9, Malignant neoplasm of colon, unspecified                        R93.3, Abnormal findings on diagnostic imaging of                         other parts of digestive tract CPT copyright 2019 American  Medical Association. All rights reserved. The codes documented in this report are preliminary and upon coder review may  be revised to meet current compliance requirements. Robert Bellow, MD 08/06/2021 12:26:56 PM This report has been signed electronically. Number of Addenda: 0 Note Initiated On: 08/06/2021 10:58 AM Scope Withdrawal Time: 0 hours 35 minutes 29 seconds  Total Procedure Duration: 1 hour 11 minutes 23 seconds  Estimated Blood Loss:  Estimated blood loss: none.      [facility]

## 2021-08-06 NOTE — Anesthesia Postprocedure Evaluation (Signed)
Anesthesia Post Note  Patient: DECKLIN POINTER  Procedure(s) Performed: COLONOSCOPY WITH PROPOFOL  Patient location during evaluation: Endoscopy Anesthesia Type: General Level of consciousness: awake and alert Pain management: pain level controlled Vital Signs Assessment: post-procedure vital signs reviewed and stable Respiratory status: spontaneous breathing, nonlabored ventilation, respiratory function stable and patient connected to nasal cannula oxygen Cardiovascular status: blood pressure returned to baseline and stable Postop Assessment: no apparent nausea or vomiting Anesthetic complications: no   No notable events documented.   Last Vitals:  Vitals:   08/06/21 1255 08/06/21 1305  BP: 127/73 136/89  Pulse: (!) 48 (!) 54  Resp: 12 19  Temp: (!) 35.2 C   SpO2: 100% 100%    Last Pain:  Vitals:   08/06/21 1305  TempSrc:   PainSc: 0-No pain                 Precious Haws Mylo Choi

## 2021-08-06 NOTE — Anesthesia Preprocedure Evaluation (Signed)
Anesthesia Evaluation  Patient identified by MRN, date of birth, ID band Patient awake    Reviewed: Allergy & Precautions, H&P , NPO status , Patient's Chart, lab work & pertinent test results  History of Anesthesia Complications Negative for: history of anesthetic complications  Airway Mallampati: III  TM Distance: <3 FB Neck ROM: limited    Dental  (+) Chipped   Pulmonary neg pulmonary ROS, neg shortness of breath,    Pulmonary exam normal        Cardiovascular Exercise Tolerance: Good hypertension, (-) angina(-) Past MI and (-) DOE Normal cardiovascular exam+ dysrhythmias Atrial Fibrillation      Neuro/Psych negative neurological ROS  negative psych ROS   GI/Hepatic negative GI ROS, Neg liver ROS, neg GERD  ,  Endo/Other  negative endocrine ROS  Renal/GU negative Renal ROS  negative genitourinary   Musculoskeletal   Abdominal   Peds  Hematology negative hematology ROS (+)   Anesthesia Other Findings Past Medical History: No date: Atrial fibrillation ([name])       Reproductive/Obstetrics negative OB ROS                             Anesthesia Physical  Anesthesia Plan  ASA: 3  Anesthesia Plan: General   Post-op Pain Management:    Induction: Intravenous  PONV Risk Score and Plan: Propofol infusion and TIVA  Airway Management Planned: Natural Airway and Nasal Cannula  Additional Equipment:   Intra-op Plan:   Post-operative Plan:   Informed Consent: I have reviewed the patients History and Physical, chart, labs and discussed the procedure including the risks, benefits and alternatives for the proposed anesthesia with the patient or authorized representative who has indicated his/her understanding and acceptance.     Dental Advisory Given  Plan Discussed with: Anesthesiologist, CRNA and Surgeon  Anesthesia Plan Comments: (Patient consented for risks of anesthesia  including but not limited to:  - adverse reactions to medications - risk of intubation if required - damage to teeth, lips or other oral mucosa - sore throat or hoarseness - Damage to heart, brain, lungs or loss of life  Patient voiced understanding.)        Anesthesia Quick Evaluation

## 2021-08-06 NOTE — Anesthesia Procedure Notes (Signed)
Date/Time: 08/06/2021 11:10 AM Performed by: Vaughan Sine Pre-anesthesia Checklist: Patient identified, Emergency Drugs available, Suction available, Patient being monitored and Timeout performed Patient Re-evaluated:Patient Re-evaluated prior to induction Oxygen Delivery Method: Nasal cannula Preoxygenation: Pre-oxygenation with 100% oxygen Induction Type: IV induction Placement Confirmation: positive ETCO2 and CO2 detector

## 2021-08-07 ENCOUNTER — Other Ambulatory Visit: Payer: Self-pay | Admitting: Anatomic Pathology & Clinical Pathology

## 2021-08-07 ENCOUNTER — Encounter: Payer: Self-pay | Admitting: General Surgery

## 2021-08-07 LAB — SURGICAL PATHOLOGY

## 2021-08-14 ENCOUNTER — Other Ambulatory Visit: Payer: Self-pay | Admitting: General Surgery

## 2021-08-14 ENCOUNTER — Other Ambulatory Visit: Payer: Medicare Other

## 2021-08-14 ENCOUNTER — Other Ambulatory Visit: Payer: Self-pay

## 2021-08-14 ENCOUNTER — Ambulatory Visit
Admission: RE | Admit: 2021-08-14 | Discharge: 2021-08-14 | Disposition: A | Discharge status: Home / Self Care | Payer: Medicare Other | Source: Ambulatory Visit | Attending: General Surgery | Admitting: General Surgery

## 2021-08-14 DIAGNOSIS — C187 Malignant neoplasm of sigmoid colon: Secondary | ICD-10-CM | POA: Insufficient documentation

## 2021-08-14 MED ORDER — IOHEXOL 350 MG/ML SOLN
100.0000 mL | Freq: Once | INTRAVENOUS | Status: AC | PRN
  Administered 2021-08-14: 100 mL via INTRAVENOUS

## 2021-08-14 NOTE — Progress Notes (Signed)
Tumor Board Documentation  BRIA HUR was presented by Dr Bary Castilla at our Tumor Board on 08/14/2021, which included representatives from surgical, pharmacy, pulmonology, genetics, radiology, pathology, nutrition, medical oncology, radiation oncology, research, palliative care, internal medicine, navigation.  [place] currently presents as an external consult, for [place], for new positive pathology with history of the following treatments: surgical intervention(s), active survellience.  Additionally, we reviewed previous medical and familial history, history of present illness, and recent lab results along with all available histopathologic and imaging studies. The tumor board considered available treatment options and made the following recommendations: Surgery (Plan to see Dr Peyton Najjar for surgical resection)    The following procedures/referrals were also placed: No orders of the defined types were placed in this encounter.   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  Tumor board is a meeting of clinicians from various specialty areas who evaluate and discuss patients for whom a multidisciplinary approach is being considered. Final determinations in the plan of care are those of the provider(s). The responsibility for follow up of recommendations given during tumor board is that of the provider.   Today's extended care, comprehensive team conference, Shrey was not present for the discussion and was not examined.   Multidisciplinary Tumor Board is a multidisciplinary case peer review process.  Decisions discussed in the Multidisciplinary Tumor Board reflect the opinions of the specialists present at the conference without having examined the patient.  Ultimately, treatment and diagnostic  decisions rest with the primary provider(s) and the patient.

## 2021-08-19 ENCOUNTER — Ambulatory Visit: Payer: Self-pay | Admitting: General Surgery

## 2021-08-19 NOTE — H&P (Signed)
PATIENT PROFILE: Edwin [name] is a 70 y.o. male who presents to the Clinic for surgical discussion of malignancy of the sigmoid colon.  PCP:  Albina Billet, MD  HISTORY OF PRESENT ILLNESS: Edwin [name] who has been previously evaluated due to finding of malignant neoplasm of the sigmoid colon.  Initially he was having work-up for his prostate cancer.  Pelvic MRI and abdominal pelvis CT scan showed a mass in the sigmoid colon.  This led to diagnostic colonoscopy.  Diagnostic colonoscopy confirmed mass in the sigmoid colon.  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  CT scan of the abdomen and pelvis did not shows any sign of distant metastases.  Chest CT scan did not shows any pulmonary metastasis.  CEA within normal limits.  Patient denies any abdominal pain.  He does endorse having some constipation, chronic.  Patient today with wife to discuss surgical management.   PROBLEM LIST: Problem List  Date Reviewed: 07/17/2021          Noted   Persistent atrial fibrillation (CMS-[name]) 10/01/2017   Essential hypertension, benign 10/01/2017       GENERAL REVIEW OF SYSTEMS:   General ROS: negative for - chills, fatigue, fever, weight gain or weight loss Allergy and Immunology ROS: negative for - hives  Hematological and Lymphatic ROS: negative for - bleeding problems or bruising, negative for palpable nodes Endocrine ROS: negative for - heat or cold intolerance, hair changes Respiratory ROS: negative for - cough, shortness of breath or wheezing Cardiovascular ROS: no chest pain or palpitations GI ROS: negative for nausea, vomiting, abdominal pain, diarrhea, positive for constipation Musculoskeletal ROS: negative for - joint swelling or muscle pain Neurological ROS: negative for - confusion, syncope Dermatological ROS: negative for pruritus and rash Psychiatric: negative for anxiety, depression, difficulty sleeping and memory loss  MEDICATIONS: Current Outpatient Medications   Medication Sig Dispense Refill   calcium carbonate 500 mg calcium (1,250 mg) chewable tablet Take 500 mg of elemental by mouth 2 (two) times daily with meals     diltiazem (CARDIZEM CD) 120 MG XR capsule TAKE 1 CAPSULE BY MOUTH ONCE DAILY 90 capsule 3   doxazosin (CARDURA) 4 MG tablet Take 1 tablet (4 mg total) by mouth nightly Take one take daily 90 tablet 2   ELIQUIS 5 mg tablet TAKE 1 TABLET BY MOUTH TWICE A DAY 180 tablet 3   flecainide (TAMBOCOR) 100 MG tablet TAKE 1 TABLET BY MOUTH TWICE A DAY 180 tablet 3   losartan (COZAAR) 50 MG tablet TAKE 1 TABLET EVERY DAY 90 tablet 3   metoprolol succinate (TOPROL-XL) 25 MG XL tablet TAKE 1 TABLET BY MOUTH EVERY DAY 90 tablet 3   polyethylene glycol (MIRALAX) powder One bottle for colonoscopy prep. Use as directed. 255 g 0   ciprofloxacin HCl (CIPRO) 500 MG tablet Take 1 tablet (500 mg total) by mouth once for 1 dose Take at 9 PM the night before surgery 1 tablet 0   neomycin 500 mg tablet Take 2 tablets at 2 pm, 3 pm and 10 pm the day before surgery. 6 tablet 0   No current facility-administered medications for this visit.    ALLERGIES: Patient has no known allergies.  PAST MEDICAL HISTORY: Past Medical History:  Diagnosis Date   Atrial fibrillation, persistent (CMS-[name])    HTN (hypertension)    Prostate cancer (CMS-[name]) 06/06/2020   Biopsy completed by Ardelle Park, MD    PAST SURGICAL HISTORY: Past Surgical History:  Procedure Laterality Date  BIOPSY PROSTATE INCISIONAL  06/06/2020   CARDIOVERSION EXTERNAL  05/27/2018   CARDIOVERSION EXTERNAL  11/10/2017   COLONOSCOPY  08/06/2021   adenocarcinoma/Referred to Dr. Peyton Najjar for resection/[facility]     FAMILY HISTORY: No family history on file.   SOCIAL HISTORY: Social History   Socioeconomic History   Marital status: Married  Tobacco Use   Smoking status: Never Smoker   Smokeless tobacco: Never Used  Scientific laboratory technician Use: Never used  Substance and Sexual Activity    Alcohol use: Yes    Alcohol/week: 42.0 standard drinks    Types: 42 Cans of beer per week    Comment: drinks 6 beers a day   Drug use: No    PHYSICAL EXAM: Vitals:   08/19/21 1532  BP: 131/76  Pulse: 56   Body mass index is 25.6 kg/m. Weight: 88 kg (194 lb)   GENERAL: Alert, active, oriented x3  HEENT: Pupils equal reactive to light. Extraocular movements are intact. Sclera clear. Palpebral conjunctiva normal red color.Pharynx clear.  NECK: Supple with no palpable mass and no adenopathy.  LUNGS: Sound clear with no rales rhonchi or wheezes.  HEART: Regular rhythm S1 and S2 without murmur.  ABDOMEN: Soft and depressible, nontender with no palpable mass, no hepatomegaly.   EXTREMITIES: Well-developed well-nourished symmetrical with no dependent edema.  NEUROLOGICAL: Awake alert oriented, facial expression symmetrical, moving all extremities.  REVIEW OF DATA: I have reviewed the following data today: Office Visit on 08/14/2021  Component Date Value   WBC (White Blood Cell Co* 08/14/2021 8.6    RBC (Red Blood Cell Coun* 08/14/2021 3.70 (!)   Hemoglobin 08/14/2021 12.3 (!)   Hematocrit 08/14/2021 36.2 (!)   MCV (Mean Corpuscular Vo* 08/14/2021 97.8    MCH (Mean Corpuscular He* 08/14/2021 33.2 (!)   MCHC (Mean Corpuscular H* 08/14/2021 34.0    Platelet Count 08/14/2021 268    RDW-CV (Red Cell Distrib* 08/14/2021 12.0    MPV (Mean Platelet Volum* 08/14/2021 9.8    Neutrophils 08/14/2021 6.24    Lymphocytes 08/14/2021 1.74    Monocytes 08/14/2021 0.52    Eosinophils 08/14/2021 0.06    Basophils 08/14/2021 0.02    Neutrophil % 08/14/2021 72.6 (!)   Lymphocyte % 08/14/2021 20.3    Monocyte % 08/14/2021 6.1    Eosinophil % 08/14/2021 0.7 (!)   Basophil% 08/14/2021 0.2    Immature Granulocyte % 08/14/2021 0.1    Immature Granulocyte Cou* 08/14/2021 0.01    Glucose 08/14/2021 105    Sodium 08/14/2021 130 (!)   Potassium 08/14/2021 4.1    Chloride 08/14/2021 94 (!)    Carbon Dioxide (CO2) 08/14/2021 28.9    Urea Nitrogen (BUN) 08/14/2021 9    Creatinine 08/14/2021 1.0    Glomerular Filtration Ra* 08/14/2021 74    Calcium 08/14/2021 9.8    AST  08/14/2021 21    ALT  08/14/2021 12    Alk Phos (alkaline Phosp* 08/14/2021 58    Albumin 08/14/2021 4.5    Bilirubin, Total 08/14/2021 0.8    Protein, Total 08/14/2021 6.9    A/G Ratio 08/14/2021 1.9    CEA - [facility] 08/14/2021 2.4      ASSESSMENT: Mr. Nelles is a 70 y.o. male presenting for discussion of surgical management of sigmoid colectomy.  Patient with invasive adenocarcinoma of the sigmoid colon.  He was oriented about the diagnosis.  He was oriented again about about surgical management.  Preoperative work-up shows no sign of distant metastases.  CEA within normal  limits.  Patient was oriented about robotic assist laparoscopic partial colectomy with anastomosis.  He was oriented about the risk of surgery include bleeding, infection, injury to adjacent organs, anastomosis leak, anastomosis obstruction, need of colostomy, among others.  Patient also oriented about risk of bleeding due to chronic use of Eliquis.  Will consult cardiology for holding Eliquis for 48 hours.  Patient reports he understood and agreed to proceed  Malignant neoplasm of sigmoid colon (CMS-[name]) [C18.7]  PLAN: Robotic assisted laparoscopic partial colectomy with anastomosis ([id number], [id number]) Labs done Take antibiotics the day before surgery as prescribed Take the bowel prep as instructed Cardiology clearance for Eliquis recommendations Contact us if you have any concern.   Patient and his wife verbalized understanding, all questions were answered, and were agreeable with the plan outlined above.   I spent a total of 45 minutes in both face-to-face and non-face-to-face activities for this visit on the date of this encounter.  Herbert Pun, MD  Electronically signed by Herbert Pun, MD

## 2021-08-19 NOTE — H&P (View-Only) (Signed)
PATIENT PROFILE: Edwin [name] is a 70 y.o. male who presents to the Clinic for surgical discussion of malignancy of the sigmoid colon.  PCP:  Albina Billet, MD  HISTORY OF PRESENT ILLNESS: Edwin [name] who has been previously evaluated due to finding of malignant neoplasm of the sigmoid colon.  Initially he was having work-up for his prostate cancer.  Pelvic MRI and abdominal pelvis CT scan showed a mass in the sigmoid colon.  This led to diagnostic colonoscopy.  Diagnostic colonoscopy confirmed mass in the sigmoid colon.  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  CT scan of the abdomen and pelvis did not shows any sign of distant metastases.  Chest CT scan did not shows any pulmonary metastasis.  CEA within normal limits.  Patient denies any abdominal pain.  He does endorse having some constipation, chronic.  Patient today with wife to discuss surgical management.   PROBLEM LIST: Problem List  Date Reviewed: 07/17/2021          Noted   Persistent atrial fibrillation (CMS-[name]) 10/01/2017   Essential hypertension, benign 10/01/2017       GENERAL REVIEW OF SYSTEMS:   General ROS: negative for - chills, fatigue, fever, weight gain or weight loss Allergy and Immunology ROS: negative for - hives  Hematological and Lymphatic ROS: negative for - bleeding problems or bruising, negative for palpable nodes Endocrine ROS: negative for - heat or cold intolerance, hair changes Respiratory ROS: negative for - cough, shortness of breath or wheezing Cardiovascular ROS: no chest pain or palpitations GI ROS: negative for nausea, vomiting, abdominal pain, diarrhea, positive for constipation Musculoskeletal ROS: negative for - joint swelling or muscle pain Neurological ROS: negative for - confusion, syncope Dermatological ROS: negative for pruritus and rash Psychiatric: negative for anxiety, depression, difficulty sleeping and memory loss  MEDICATIONS: Current Outpatient Medications   Medication Sig Dispense Refill   calcium carbonate 500 mg calcium (1,250 mg) chewable tablet Take 500 mg of elemental by mouth 2 (two) times daily with meals     diltiazem (CARDIZEM CD) 120 MG XR capsule TAKE 1 CAPSULE BY MOUTH ONCE DAILY 90 capsule 3   doxazosin (CARDURA) 4 MG tablet Take 1 tablet (4 mg total) by mouth nightly Take one take daily 90 tablet 2   ELIQUIS 5 mg tablet TAKE 1 TABLET BY MOUTH TWICE A DAY 180 tablet 3   flecainide (TAMBOCOR) 100 MG tablet TAKE 1 TABLET BY MOUTH TWICE A DAY 180 tablet 3   losartan (COZAAR) 50 MG tablet TAKE 1 TABLET EVERY DAY 90 tablet 3   metoprolol succinate (TOPROL-XL) 25 MG XL tablet TAKE 1 TABLET BY MOUTH EVERY DAY 90 tablet 3   polyethylene glycol (MIRALAX) powder One bottle for colonoscopy prep. Use as directed. 255 g 0   ciprofloxacin HCl (CIPRO) 500 MG tablet Take 1 tablet (500 mg total) by mouth once for 1 dose Take at 9 PM the night before surgery 1 tablet 0   neomycin 500 mg tablet Take 2 tablets at 2 pm, 3 pm and 10 pm the day before surgery. 6 tablet 0   No current facility-administered medications for this visit.    ALLERGIES: Patient has no known allergies.  PAST MEDICAL HISTORY: Past Medical History:  Diagnosis Date   Atrial fibrillation, persistent (CMS-[name])    HTN (hypertension)    Prostate cancer (CMS-[name]) 06/06/2020   Biopsy completed by Ardelle Park, MD    PAST SURGICAL HISTORY: Past Surgical History:  Procedure Laterality Date  BIOPSY PROSTATE INCISIONAL  06/06/2020   CARDIOVERSION EXTERNAL  05/27/2018   CARDIOVERSION EXTERNAL  11/10/2017   COLONOSCOPY  08/06/2021   adenocarcinoma/Referred to Dr. Peyton Najjar for resection/[facility]WB     FAMILY HISTORY: No family history on file.   SOCIAL HISTORY: Social History   Socioeconomic History   Marital status: Married  Tobacco Use   Smoking status: Never Smoker   Smokeless tobacco: Never Used  Scientific laboratory technician Use: Never used  Substance and Sexual Activity    Alcohol use: Yes    Alcohol/week: 42.0 standard drinks    Types: 42 Cans of beer per week    Comment: drinks 6 beers a day   Drug use: No    PHYSICAL EXAM: Vitals:   08/19/21 1532  BP: 131/76  Pulse: 56   Body mass index is 25.6 kg/m. Weight: 88 kg (194 lb)   GENERAL: Alert, active, oriented x3  HEENT: Pupils equal reactive to light. Extraocular movements are intact. Sclera clear. Palpebral conjunctiva normal red color.Pharynx clear.  NECK: Supple with no palpable mass and no adenopathy.  LUNGS: Sound clear with no rales rhonchi or wheezes.  HEART: Regular rhythm S1 and S2 without murmur.  ABDOMEN: Soft and depressible, nontender with no palpable mass, no hepatomegaly.   EXTREMITIES: Well-developed well-nourished symmetrical with no dependent edema.  NEUROLOGICAL: Awake alert oriented, facial expression symmetrical, moving all extremities.  REVIEW OF DATA: I have reviewed the following data today: Office Visit on 08/14/2021  Component Date Value   WBC (White Blood Cell Co* 08/14/2021 8.6    RBC (Red Blood Cell Coun* 08/14/2021 3.70 (!)   Hemoglobin 08/14/2021 12.3 (!)   Hematocrit 08/14/2021 36.2 (!)   MCV (Mean Corpuscular Vo* 08/14/2021 97.8    MCH (Mean Corpuscular He* 08/14/2021 33.2 (!)   MCHC (Mean Corpuscular H* 08/14/2021 34.0    Platelet Count 08/14/2021 268    RDW-CV (Red Cell Distrib* 08/14/2021 12.0    MPV (Mean Platelet Volum* 08/14/2021 9.8    Neutrophils 08/14/2021 6.24    Lymphocytes 08/14/2021 1.74    Monocytes 08/14/2021 0.52    Eosinophils 08/14/2021 0.06    Basophils 08/14/2021 0.02    Neutrophil % 08/14/2021 72.6 (!)   Lymphocyte % 08/14/2021 20.3    Monocyte % 08/14/2021 6.1    Eosinophil % 08/14/2021 0.7 (!)   Basophil% 08/14/2021 0.2    Immature Granulocyte % 08/14/2021 0.1    Immature Granulocyte Cou* 08/14/2021 0.01    Glucose 08/14/2021 105    Sodium 08/14/2021 130 (!)   Potassium 08/14/2021 4.1    Chloride 08/14/2021 94 (!)    Carbon Dioxide (CO2) 08/14/2021 28.9    Urea Nitrogen (BUN) 08/14/2021 9    Creatinine 08/14/2021 1.0    Glomerular Filtration Ra* 08/14/2021 74    Calcium 08/14/2021 9.8    AST  08/14/2021 21    ALT  08/14/2021 12    Alk Phos (alkaline Phosp* 08/14/2021 58    Albumin 08/14/2021 4.5    Bilirubin, Total 08/14/2021 0.8    Protein, Total 08/14/2021 6.9    A/G Ratio 08/14/2021 1.9    CEA - [facility] 08/14/2021 2.4      ASSESSMENT: Edwin [name] is a 70 y.o. male presenting for discussion of surgical management of sigmoid colectomy.  Patient with invasive adenocarcinoma of the sigmoid colon.  He was oriented about the diagnosis.  He was oriented again about about surgical management.  Preoperative work-up shows no sign of distant metastases.  CEA within normal  limits.  Patient was oriented about robotic assist laparoscopic partial colectomy with anastomosis.  He was oriented about the risk of surgery include bleeding, infection, injury to adjacent organs, anastomosis leak, anastomosis obstruction, need of colostomy, among others.  Patient also oriented about risk of bleeding due to chronic use of Eliquis.  Will consult cardiology for holding Eliquis for 48 hours.  Patient reports he understood and agreed to proceed  Malignant neoplasm of sigmoid colon (CMS-[name]) [C18.7]  PLAN: Robotic assisted laparoscopic partial colectomy with anastomosis ([id number], [id number]) Labs done Take antibiotics the day before surgery as prescribed Take the bowel prep as instructed Cardiology clearance for Eliquis recommendations Contact us if you have any concern.   Patient and his wife verbalized understanding, all questions were answered, and were agreeable with the plan outlined above.   I spent a total of 45 minutes in both face-to-face and non-face-to-face activities for this visit on the date of this encounter.  Herbert Pun, MD  Electronically signed by Herbert Pun, MD

## 2021-08-20 ENCOUNTER — Other Ambulatory Visit
Admission: RE | Admit: 2021-08-20 | Discharge: 2021-08-20 | Disposition: A | Discharge status: Home / Self Care | Payer: Medicare Other | Source: Ambulatory Visit | Attending: General Surgery | Admitting: General Surgery

## 2021-08-20 ENCOUNTER — Other Ambulatory Visit: Payer: Self-pay

## 2021-08-20 HISTORY — DX: Malignant (primary) neoplasm, unspecified: C80.1

## 2021-08-20 HISTORY — DX: Cardiac arrhythmia, unspecified: I49.9

## 2021-08-20 NOTE — Patient Instructions (Addendum)
Your procedure is scheduled on: 08/27/21 - Wednesday Report to the Registration Desk for check in on the 1st floor of the [facility]. To find out your arrival time, please call [phone] between 1PM - 3PM on: 08/26/21 - Tuesday Report To [facility] for Labs/EKG/Covid Test on 08/25/21 - 8:00 am.  REMEMBER: Instructions that are not followed completely may result in serious medical risk, up to and including death; or upon the discretion of your surgeon and anesthesiologist your surgery may need to be rescheduled.  FOLLOW BOWEL PREP INSTRUCTIONS PROVIDED TO YOU BY DR,CINTRON-DIAZ .  TAKE THESE MEDICATIONS THE MORNING OF SURGERY WITH A SIP OF WATER:  - diltiazem (CARDIZEM CD) 120 MG 24 hr capsule - flecainide (TAMBOCOR) 100 MG tablet  Follow recommendations from Cardiologist, Pulmonologist or PCP regarding stopping Aspirin, Coumadin, Plavix, Eliquis, Pradaxa, or Pletal. Stop taking beginning 08/25/21, may resume per MD order.  One week prior to surgery: Stop Anti-inflammatories (NSAIDS) such as Advil, Aleve, Ibuprofen, Motrin, Naproxen, Naprosyn and Aspirin based products such as Excedrin, Goodys Powder, BC Powder.  Stop ANY OVER THE COUNTER supplements until after surgery.  You may however, continue to take Tylenol if needed for pain up until the day of surgery.  No Alcohol for 24 hours before or after surgery.  No Smoking including e-cigarettes for 24 hours prior to surgery.  No chewable tobacco products for at least 6 hours prior to surgery.  No nicotine patches on the day of surgery.  Do not use any "recreational" drugs for at least a week prior to your surgery.  Please be advised that the combination of cocaine and anesthesia may have negative outcomes, up to and including death. If you test positive for cocaine, your surgery will be cancelled.  On the morning of surgery brush your teeth with toothpaste and water, you may rinse your mouth with mouthwash if you  wish. Do not swallow any toothpaste or mouthwash.  Use CHG Soap or wipes as directed on instruction sheet.  Do not wear jewelry, make-up, hairpins, clips or nail polish.  Do not wear lotions, powders, or perfumes.   Do not shave body from the neck down 48 hours prior to surgery just in case you cut yourself which could leave a site for infection.  Also, freshly shaved skin may become irritated if using the CHG soap.  Contact lenses, hearing aids and dentures may not be worn into surgery.  Do not bring valuables to the hospital. [facility] is not responsible for any missing/lost belongings or valuables.   FOLLOW bowel prep as directed.  Notify your doctor if there is any change in your medical condition (cold, fever, infection).  Wear comfortable clothing (specific to your surgery type) to the hospital.  After surgery, you can help prevent lung complications by doing breathing exercises.  Take deep breaths and cough every 1-2 hours. Your doctor may order a device called an Incentive Spirometer to help you take deep breaths. When coughing or sneezing, hold a pillow firmly against your incision with both hands. This is called "splinting." Doing this helps protect your incision. It also decreases belly discomfort.  If you are being admitted to the hospital overnight, leave your suitcase in the car. After surgery it may be brought to your room.  If you are being discharged the day of surgery, you will not be allowed to drive home. You will need a responsible adult (18 years or older) to drive you home and stay with you that  night.   If you are taking public transportation, you will need to have a responsible adult (18 years or older) with you. Please confirm with your physician that it is acceptable to use public transportation.   Please call the [facility] at [phone] if you have any questions about these instructions.  Surgery Visitation Policy:  Patients  undergoing a surgery or procedure may have one family member or support person with them as long as that person is not COVID-19 positive or experiencing its symptoms.  That person may remain in the waiting area during the procedure and may rotate out with other people.  Inpatient Visitation:    Visiting hours are 7 a.m. to 8 p.m. Up to two visitors ages 16+ are allowed at one time in a patient room. The visitors may rotate out with other people during the day. Visitors must check out when they leave, or other visitors will not be allowed. One designated support person may remain overnight. The visitor must pass COVID-19 screenings, use hand sanitizer when entering and exiting the patient's room and wear a mask at all times, including in the patient's room. Patients must also wear a mask when staff or their visitor are in the room. Masking is required regardless of vaccination status.

## 2021-08-21 ENCOUNTER — Encounter: Payer: Self-pay | Admitting: General Surgery

## 2021-08-21 NOTE — Progress Notes (Signed)
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 Clinical nursing staff has updated patient's PMH/PSHx, current medication list, and drug allergies/intolerances to ensure comprehensive history available to assist in medical decision making as it pertains to the aforementioned surgical procedure and anticipated anesthetic course. Extensive review of available clinical information performed. [place] PMH and PSHx updated with any diagnoses/procedures that  may have been inadvertently omitted during his intake with the pre-admission testing department's nursing staff.  Clinical Discussion:  Edwin [name] is a 70 y.o. male who is submitted for pre-surgical anesthesia review and clearance prior to him undergoing the above procedure. Patient has never been a smoker.  Pertinent PMH includes: atrial fibrillation/flutter, valvular regurgitation, LVH, coronary artery atherosclerosis, aortic atherosclerosis, HTN, acid reflux (uses PRN CaCO3), prostate cancer, colon cancer.  Patient is followed by cardiology Ubaldo Glassing, MD). He was last seen in the cardiology clinic on 07/17/2021; notes reviewed.  At the time of his clinic visit, patient doing well overall from a cardiovascular perspective.  He denied any episodes of chest pain, shortness of breath, PND, orthopnea, significant peripheral edema, or presyncope/syncope.  Patient with intermittent episodes of dizziness associated with position  changes.  PMH significant for cardiovascular diagnoses.  Long-term cardiac event monitor study performed on 09/20/2017 revealed a predominant underlying atrial fibrillation; minimum heart rate 59 bpm, maximum heart rate 166 bpm, and average heart rate 96 bpm.  There were rare PVCs with no ventricular runs.  There were no prolonged pauses greater than 2 seconds.  TTE performed on 09/23/2017 revealed a normal left ventricular systolic function with mild LVH; LVEF 55%.  There was mild mitral, tricuspid, and pulmonary valve regurgitation.  No evidence of valvular stenosis.  Patient underwent DCCV procedure on 11/10/2017.  200 J shocks x 3 were delivered with a postprocedural atrial flutter noted on the monitor.  Placement placed on flecainide.  Patient underwent second DCCV procedure on 05/27/2018 for recurrent nonsustained atrial fibrillation/flutter.  This procedure required a single 120 J shock for converting patient to normal sinus rhythm.    PMH significant for paroxysmal atrial fibrillation/flutter; CHA2DS2-VASc Score = 3 (age, HTN, aortic plaque).  Rate and rhythm maintained on oral diltiazem, metoprolol, and flecainide.  Patient is chronically anticoagulated using daily apixaban; compliant with therapy with no evidence of GI bleeding.  Blood pressure reasonably controlled on prescribed CCB, alpha blocker, ARB, and beta-blocker therapies.  Patient not currently taking any type of lipid-lowering therapies for ASCVD prevention.  He is not diabetic. Functional capacity, as defined by DASI, is documented as being >/= 4 METS.  No changes were made to patient's medication regimen.  Patient to follow-up with outpatient cardiology in 3 months or sooner if needed.  Edwin [name] recently found to have a colonic mass during routine colonoscopy.  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  Patient is scheduled for an [place] on 08/27/2021 with Dr. Herbert Pun, MD.  Given patient's past medical history significant for cardiovascular diagnoses, presurgical  cardiac clearance was sought by the performing surgeon's office and PAT team. Per cardiology, "this patient is optimized for surgery and may proceed with the planned procedural course with a LOW risk of significant perioperative cardiovascular complications".  Again, this patient is on daily anticoagulation therapy. He has been instructed on recommendations for holding his apixaban for 2 days prior to his procedure with plans to restart as soon as postoperative bleeding risk felt to be minimized by his attending surgeon. The patient has been instructed that his last dose of his anticoagulant will be on 08/24/2021.  Patient denies previous perioperative complications with anesthesia in the past. In review of the available records, it is noted that patient underwent a general anesthetic course here (ASA III) in 07/2021 without documented complications.   Vitals with BMI 08/06/2021 08/06/2021 08/06/2021  Height - - -  Weight - - -  BMI - - -  Systolic 528 413 244  Diastolic 89 73 69  Pulse 54 48 50    Providers/Specialists:   NOTE: Primary physician provider listed below. Patient may have been seen by APP or partner within same practice.   PROVIDER ROLE / SPECIALTY LAST Tanna Savoy, MD General Surgery 08/19/2021  Albina Billet, MD Primary Care Provider ???  Bartholome Bill, MD Cardiology 07/17/2021   Allergies:  Patient has no known allergies.  Current Home Medications:   No current facility-administered medications for this encounter.    calcium carbonate (TUMS - DOSED IN MG ELEMENTAL CALCIUM) 500 MG chewable tablet   diltiazem (CARDIZEM CD) 120 MG 24 hr capsule   doxazosin (CARDURA) 4 MG tablet   ELIQUIS 5 MG TABS tablet   flecainide (TAMBOCOR) 100 MG tablet   losartan (COZAAR) 50 MG tablet   metoprolol succinate (TOPROL-XL) 25 MG 24 hr tablet   History:   Past  Medical History:  Diagnosis Date   Acid reflux    Adenocarcinoma of colon ([place]) 08/06/2021   a.) CT on 07/10/2021 --> 20-25 cm from anal verge; measured 3.7 x 2.7 cm.  b.) pathology (+) for invasive moderately differentiated adenocarcinoma.   Aortic atherosclerosis ([name])    Atherosclerosis of coronary artery    a.) CT chest on 08/14/2021 --> moderate.   Atrial fibrillation and flutter ([place])    a.) CHA2DS2VASc = 3 (age, HTN, aortic plaque); on apixaban.   Chronic anticoagulation    a.) Apixaban   Hypertension    Lesion of right lobe of liver    a.) CT on 07/10/2021 --> 1.0 cm and 0.8 cm lesions in anterior RIGHT lobe.   LVH (left ventricular hypertrophy)    a.) TTE on 09/16/2017 - mild   Prostate cancer ([place])    Valvular regurgitation    a.) TTE on 09/23/2017 --> LVEF 55%; mild MV, TV, PV   Past Surgical History:  Procedure Laterality Date   CARDIOVERSION N/A 11/10/2017   Procedure: CARDIOVERSION;  Surgeon: Teodoro Spray, MD;  Location: [facility];  Service: Cardiovascular;  Laterality: N/A;   CARDIOVERSION N/A 05/27/2018   Procedure: CARDIOVERSION;  Surgeon: Teodoro Spray, MD;  Location: [name];  Service: Cardiovascular;  Laterality: N/A;   COLONOSCOPY WITH PROPOFOL N/A 08/06/2021   Procedure: COLONOSCOPY WITH PROPOFOL;  Surgeon: Robert Bellow, MD;  Location: [facility];  Service: Endoscopy;  Laterality: N/A;   PROSTATE BIOPSY N/A 06/06/2020   Procedure: PROSTATE BIOPSY [name];  Surgeon: Royston Cowper, MD;  Location: [name];  Service: Urology;  Laterality: N/A;   No family  history on file. Social History   Tobacco Use   Smoking status: Never   Smokeless tobacco: Never  Vaping Use   Vaping Use: Never used  Substance Use Topics   Alcohol use: Yes    Comment: 6-8 beers daily   Drug use: No    Pertinent Clinical Results:  LABS: Labs reviewed: Acceptable for surgery.   Ref Range & Units 08/14/2021  WBC (White Blood Cell Count) 4.1 - 10.2 10^3/uL 8.6   RBC  (Red Blood Cell Count) 4.69 - 6.13 10^6/uL 3.70 Low    Hemoglobin 14.1 - 18.1 gm/dL 12.3 Low    Hematocrit 40.0 - 52.0 % 36.2 Low    MCV (Mean Corpuscular Volume) 80.0 - 100.0 fl 97.8   MCH (Mean Corpuscular Hemoglobin) 27.0 - 31.2 pg 33.2 High    MCHC (Mean Corpuscular Hemoglobin Concentration) 32.0 - 36.0 gm/dL 34.0   Platelet Count 150 - 450 10^3/uL 268   RDW-CV (Red Cell Distribution Width) 11.6 - 14.8 % 12.0   MPV (Mean Platelet Volume) 9.4 - 12.4 fl 9.8   Neutrophils 1.50 - 7.80 10^3/uL 6.24   Lymphocytes 1.00 - 3.60 10^3/uL 1.74   Monocytes 0.00 - 1.50 10^3/uL 0.52   Eosinophils 0.00 - 0.55 10^3/uL 0.06   Basophils 0.00 - 0.09 10^3/uL 0.02   Neutrophil % 32.0 - 70.0 % 72.6 High    Lymphocyte % 10.0 - 50.0 % 20.3   Monocyte % 4.0 - 13.0 % 6.1   Eosinophil % 1.0 - 5.0 % 0.7 Low    Basophil% 0.0 - 2.0 % 0.2   Immature Granulocyte % <=0.7 % 0.1   Immature Granulocyte Count <=0.06 10^3/L 0.01   Resulting Agency  [facility]  Specimen Collected: 08/14/21 15:55 Last Resulted: 08/14/21 16:48  Received From: [facility]  Result Received: 08/19/21 16:18    Ref Range & Units 08/14/2021  Glucose 70 - 110 mg/dL 105   Sodium 136 - 145 mmol/L 130 Low    Potassium 3.6 - 5.1 mmol/L 4.1   Chloride 97 - 109 mmol/L 94 Low    Carbon Dioxide (CO2) 22.0 - 32.0 mmol/L 28.9   Urea Nitrogen (BUN) 7 - 25 mg/dL 9   Creatinine 0.7 - 1.3 mg/dL 1.0   Glomerular Filtration Rate (eGFR), MDRD Estimate >60 mL/min/1.73sq m 74   Calcium 8.7 - 10.3 mg/dL 9.8   AST  8 - 39 U/L 21   ALT  6 - 57 U/L 12   Alk Phos (alkaline Phosphatase) 34 - 104 U/L 58   Albumin 3.5 - 4.8 g/dL 4.5   Bilirubin, Total 0.3 - 1.2 mg/dL 0.8   Protein, Total 6.1 - 7.9 g/dL 6.9   A/G Ratio 1.0 - 5.0 gm/dL 1.9   Resulting Agency  [facility]  Specimen Collected: 08/14/21 15:55 Last Resulted: 08/14/21 17:08  Received From: [facility]  Result Received: 08/19/21 16:18     ECG: Date: 08/25/2021 Time ECG obtained: 0825 AM Rate: 53 bpm Rhythm: sinus bradycardia Axis (leads I and aVF): Normal Intervals: PR 168 ms. QRS 102 ms. QTc 409 ms. ST segment and T wave changes: No evidence of acute ST segment elevation or depression Comparison: Similar to previous tracing obtained on 03/17/2020   IMAGING / PROCEDURES: CT CHEST WITH CONTRAST performed on 08/14/2021 No evidence of intrathoracic metastases No acute intrathoracic process Stable indeterminate liver lesions, metastatic disease not excluded Moderate atherosclerosis of the coronary arteries  CT ABDOMEN PELVIS  WITH CONTRAST performed on 07/10/2021 The colon is very redundant, particularly the sigmoid colon, and there is a fungating endoluminal mass at the left aspect of the sigmoid colon, approximately 20-25 cm from the anal verge, measuring at least 3.7 x 2.7 cm. Findings are consistent with primary colon malignancy. No definite evidence of lymphadenopathy or metastatic disease in the abdomen or pelvis. There are two small enhancing lesions of the anterior right lobe of the liver measuring 1.0 cm and 0.8 cm. These most likely reflect small flash filling hemangiomata, hepatic metastases not favored although not strictly excluded. Due to small size, these may be technically difficult to characterize by multiphasic MR. Attention on follow-up. Aortic atherosclerosis  MRI PROSTATE WITH/WITHOUT CONTRAST performed on 07/02/2021 Colonic neoplasm likely distal sigmoid, difficult to localize due to limited imaging of the pelvis, potentially in sigmoid colon. Suggest CT of the abdomen and pelvis as this is more likely visible on CT for better localization, given that only limited imaging of the pelvis is present currently. Colonoscopy is also recommended. PIRADS category 4 lesion at the LEFT prostate base. More defined on T2 than on previous imaging and more pronounced on diffusion. PIRADS category 4 lesion at the  LEFT prostate apex is similar to prior imaging. Changes of BPH. No evidence of extracapsular extension, seminal vesicle invasion, or metastatic disease in the pelvis  TRANSTHORACIC ECHOCARDIOGRAM performed on 09/23/2017 LVEF 55% Normal left ventricular systolic function with mild LVH Normal right ventricular systolic function Mild MR, TR, and PR No AR No evidence of valvular stenosis No pericardial effusion  LONG TERM CARDIAC EVENT MONITOR STUDY performed on 09/20/2017 Predominant underlying atrial fibrillation with a minimum HR of 59 bpm, maximum HR 166 bpm, and average HR of 96 bpm. Rare ventricular premature beats No prolonged pauses greater than 2 seconds No ventricular runs  Impression and Plan:  Edwin [name] has been referred for pre-anesthesia review and clearance prior to him undergoing the planned anesthetic and procedural courses. Available labs, pertinent testing, and imaging results were personally reviewed by me. This patient has been appropriately cleared by cardiology with an overall LOW risk of significant perioperative cardiovascular complications.  Based on clinical review performed today (08/25/21), barring any significant acute changes in the patient's overall condition, it is anticipated that he will be able to proceed with the planned surgical intervention. Any acute changes in clinical condition may necessitate his procedure being postponed and/or cancelled. Patient will meet with anesthesia team (MD and/or CRNA) on the day of his procedure for preoperative evaluation/assessment. Questions regarding anesthetic course will be fielded at that time.   Pre-surgical instructions were reviewed with the patient during his PAT appointment and questions were fielded by PAT clinical staff. Patient was advised that if any questions or concerns arise prior to his procedure then he should return a call to PAT and/or his surgeon's office to discuss.  Honor Loh, MSN, APRN, FNP-C,  CEN [facility]  Peri-operative Services Nurse Practitioner Phone: [phone] Fax: [phone] 08/25/21 8:47 AM  NOTE: This note has been prepared using Dragon dictation software. Despite my best ability to proofread, there is always the potential that unintentional transcriptional errors may still occur from this process.

## 2021-08-25 ENCOUNTER — Other Ambulatory Visit
Admission: RE | Admit: 2021-08-25 | Discharge: 2021-08-25 | Disposition: A | Discharge status: Home / Self Care | Payer: Medicare Other | Source: Ambulatory Visit | Attending: General Surgery | Admitting: General Surgery

## 2021-08-25 ENCOUNTER — Other Ambulatory Visit: Payer: Self-pay

## 2021-08-25 DIAGNOSIS — Z01818 Encounter for other preprocedural examination: Secondary | ICD-10-CM | POA: Insufficient documentation

## 2021-08-25 DIAGNOSIS — Z20822 Contact with and (suspected) exposure to covid-19: Secondary | ICD-10-CM | POA: Insufficient documentation

## 2021-08-25 LAB — BASIC METABOLIC PANEL
Anion gap: 7 (ref 5–15)
BUN: 9 mg/dL (ref 8–23)
CO2: 27 mmol/L (ref 22–32)
Calcium: 9.6 mg/dL (ref 8.9–10.3)
Chloride: 98 mmol/L (ref 98–111)
Creatinine, Ser: 1.03 mg/dL (ref 0.61–1.24)
GFR, Estimated: >60 mL/min (ref >60)
Glucose, Bld: 126 mg/dL — ABNORMAL HIGH (ref 70–99)
Potassium: 3.7 mmol/L (ref 3.5–5.1)
Sodium: 132 mmol/L — ABNORMAL LOW (ref 135–145)

## 2021-08-25 LAB — TYPE AND SCREEN
ABO/RH(D): O POS
Antibody Screen: NEGATIVE

## 2021-08-25 LAB — CBC
HCT: 36 % — ABNORMAL LOW (ref 39.0–52.0)
Hemoglobin: 12.8 g/dL — ABNORMAL LOW (ref 13.0–17.0)
MCH: 34.6 pg — ABNORMAL HIGH (ref 26.0–34.0)
MCHC: 35.6 g/dL (ref 30.0–36.0)
MCV: 97.3 fL (ref 80.0–100.0)
Platelets: 289 10*3/uL (ref 150–400)
RBC: 3.7 MIL/uL — ABNORMAL LOW (ref 4.22–5.81)
RDW: 12.2 % (ref 11.5–15.5)
WBC: 6 10*3/uL (ref 4.0–10.5)
nRBC: 0 % (ref 0.0–0.2)

## 2021-08-25 LAB — SARS CORONAVIRUS 2 (TAT 6-24 HRS): SARS Coronavirus 2: NEGATIVE

## 2021-08-26 MED ORDER — ORAL CARE MOUTH RINSE
15.0000 mL | Freq: Once | OROMUCOSAL | Status: AC
Start: 2021-08-26 — End: 2021-08-27

## 2021-08-26 MED ORDER — CHLORHEXIDINE GLUCONATE 0.12 % MT SOLN: 15.0000 mL | Freq: Once | OROMUCOSAL | Status: AC

## 2021-08-26 MED ORDER — GABAPENTIN 300 MG PO CAPS: 300.0000 mg | ORAL_CAPSULE | ORAL | Status: AC

## 2021-08-26 MED ORDER — CELECOXIB 200 MG PO CAPS: 200.0000 mg | ORAL_CAPSULE | ORAL | Status: AC

## 2021-08-26 MED ORDER — SODIUM CHLORIDE 0.9 % IV SOLN
2.0000 g | INTRAVENOUS | Status: AC
  Administered 2021-08-27: 2 g via INTRAVENOUS

## 2021-08-26 MED ORDER — FAMOTIDINE 20 MG PO TABS: 20.0000 mg | ORAL_TABLET | Freq: Once | ORAL | Status: AC

## 2021-08-26 MED ORDER — ALVIMOPAN 12 MG PO CAPS: 12.0000 mg | ORAL_CAPSULE | ORAL | Status: AC

## 2021-08-26 MED ORDER — BUPIVACAINE LIPOSOME 1.3 % IJ SUSP: 20.0000 mL | Freq: Once | INTRAMUSCULAR | Status: DC

## 2021-08-26 MED ORDER — ACETAMINOPHEN 500 MG PO TABS: 1000.0000 mg | ORAL_TABLET | ORAL | Status: AC

## 2021-08-26 MED ORDER — LACTATED RINGERS IV SOLN: INTRAVENOUS | Status: DC

## 2021-08-27 ENCOUNTER — Inpatient Hospital Stay
Admission: RE | Admit: 2021-08-27 | Discharge: 2021-08-29 | DRG: 330 | Disposition: A | Discharge status: Home / Self Care | Payer: Medicare Other | Attending: General Surgery | Admitting: General Surgery

## 2021-08-27 ENCOUNTER — Encounter
Admission: RE | Disposition: A | Discharge status: Home / Self Care | Payer: Self-pay | Source: Ambulatory Visit | Attending: General Surgery

## 2021-08-27 ENCOUNTER — Inpatient Hospital Stay: Payer: Medicare Other | Admitting: Urgent Care

## 2021-08-27 ENCOUNTER — Other Ambulatory Visit: Payer: Self-pay

## 2021-08-27 ENCOUNTER — Encounter: Payer: Self-pay | Admitting: General Surgery

## 2021-08-27 DIAGNOSIS — K5909 Other constipation: Secondary | ICD-10-CM | POA: Diagnosis present

## 2021-08-27 DIAGNOSIS — Z79899 Other long term (current) drug therapy: Secondary | ICD-10-CM | POA: Diagnosis not present

## 2021-08-27 DIAGNOSIS — Z8546 Personal history of malignant neoplasm of prostate: Secondary | ICD-10-CM

## 2021-08-27 DIAGNOSIS — Z7901 Long term (current) use of anticoagulants: Secondary | ICD-10-CM

## 2021-08-27 DIAGNOSIS — I700 Atherosclerosis of aorta: Secondary | ICD-10-CM | POA: Diagnosis present

## 2021-08-27 DIAGNOSIS — I4819 Other persistent atrial fibrillation: Secondary | ICD-10-CM | POA: Diagnosis present

## 2021-08-27 DIAGNOSIS — I10 Essential (primary) hypertension: Secondary | ICD-10-CM | POA: Diagnosis present

## 2021-08-27 DIAGNOSIS — I2510 Atherosclerotic heart disease of native coronary artery without angina pectoris: Secondary | ICD-10-CM | POA: Diagnosis present

## 2021-08-27 DIAGNOSIS — K219 Gastro-esophageal reflux disease without esophagitis: Secondary | ICD-10-CM | POA: Diagnosis present

## 2021-08-27 DIAGNOSIS — Z20822 Contact with and (suspected) exposure to covid-19: Secondary | ICD-10-CM | POA: Diagnosis present

## 2021-08-27 DIAGNOSIS — C187 Malignant neoplasm of sigmoid colon: Secondary | ICD-10-CM | POA: Diagnosis present

## 2021-08-27 DIAGNOSIS — C189 Malignant neoplasm of colon, unspecified: Secondary | ICD-10-CM | POA: Diagnosis present

## 2021-08-27 HISTORY — DX: Atherosclerotic heart disease of native coronary artery without angina pectoris: I25.10

## 2021-08-27 HISTORY — DX: Unspecified atrial fibrillation: I48.91

## 2021-08-27 HISTORY — DX: Malignant neoplasm of prostate: C61

## 2021-08-27 HISTORY — DX: Cardiomegaly: I51.7

## 2021-08-27 HISTORY — DX: Atherosclerosis of aorta: I70.0

## 2021-08-27 HISTORY — DX: Liver disease, unspecified: K76.9

## 2021-08-27 HISTORY — DX: Endocarditis, valve unspecified: I38

## 2021-08-27 HISTORY — DX: Gastro-esophageal reflux disease without esophagitis: K21.9

## 2021-08-27 HISTORY — DX: Long term (current) use of anticoagulants: Z79.01

## 2021-08-27 LAB — ABO/RH: ABO/RH(D): O POS

## 2021-08-27 SURGERY — COLECTOMY, SIGMOID, ROBOT-ASSISTED
Anesthesia: General | Site: Abdomen

## 2021-08-27 MED ORDER — DOXAZOSIN MESYLATE 4 MG PO TABS
4.0000 mg | ORAL_TABLET | Freq: Every evening | ORAL | Status: DC
  Administered 2021-08-28: 4 mg via ORAL
  Filled 2021-08-27 (×2): qty 1

## 2021-08-27 MED ORDER — PROPOFOL 10 MG/ML IV BOLUS
INTRAVENOUS | Status: DC | PRN
  Administered 2021-08-27: 125 mg via INTRAVENOUS

## 2021-08-27 MED ORDER — BUPIVACAINE LIPOSOME 1.3 % IJ SUSP
INTRAMUSCULAR | Status: AC
  Filled 2021-08-27: qty 20

## 2021-08-27 MED ORDER — ROCURONIUM BROMIDE 10 MG/ML (PF) SYRINGE
PREFILLED_SYRINGE | INTRAVENOUS | Status: AC
  Filled 2021-08-27: qty 10

## 2021-08-27 MED ORDER — EPINEPHRINE 1 MG/10ML IJ SOSY
PREFILLED_SYRINGE | INTRAMUSCULAR | Status: DC | PRN
  Administered 2021-08-27: 5 ug via INTRAVENOUS
  Administered 2021-08-27 (×2): 20 ug via INTRAVENOUS
  Administered 2021-08-27: 5 ug via INTRAVENOUS
  Administered 2021-08-27: 20 ug via INTRAVENOUS
  Administered 2021-08-27: 10 ug via INTRAVENOUS
  Administered 2021-08-27: 20 ug via INTRAVENOUS

## 2021-08-27 MED ORDER — CHLORHEXIDINE GLUCONATE 0.12 % MT SOLN
OROMUCOSAL | Status: AC
  Administered 2021-08-27: 15 mL via OROMUCOSAL
  Filled 2021-08-27: qty 15

## 2021-08-27 MED ORDER — GLYCOPYRROLATE 0.2 MG/ML IJ SOLN
INTRAMUSCULAR | Status: AC
  Filled 2021-08-27: qty 1

## 2021-08-27 MED ORDER — PHENYLEPHRINE HCL (PRESSORS) 10 MG/ML IV SOLN
INTRAVENOUS | Status: DC | PRN
  Administered 2021-08-27: 50 ug via INTRAVENOUS

## 2021-08-27 MED ORDER — ENOXAPARIN SODIUM 40 MG/0.4ML IJ SOSY
40.0000 mg | PREFILLED_SYRINGE | INTRAMUSCULAR | Status: DC
  Administered 2021-08-28: 40 mg via SUBCUTANEOUS
  Filled 2021-08-27 (×2): qty 0.4

## 2021-08-27 MED ORDER — CELECOXIB 200 MG PO CAPS
200.0000 mg | ORAL_CAPSULE | Freq: Two times a day (BID) | ORAL | Status: DC
  Administered 2021-08-27 – 2021-08-29 (×4): 200 mg via ORAL
  Filled 2021-08-27 (×5): qty 1

## 2021-08-27 MED ORDER — ONDANSETRON HCL 4 MG/2ML IJ SOLN: 4.0000 mg | Freq: Four times a day (QID) | INTRAMUSCULAR | Status: DC | PRN

## 2021-08-27 MED ORDER — ONDANSETRON HCL 4 MG/2ML IJ SOLN
INTRAMUSCULAR | Status: AC
  Filled 2021-08-27: qty 2

## 2021-08-27 MED ORDER — ONDANSETRON 4 MG PO TBDP: 4.0000 mg | ORAL_TABLET | Freq: Four times a day (QID) | ORAL | Status: DC | PRN

## 2021-08-27 MED ORDER — GLYCOPYRROLATE 0.2 MG/ML IJ SOLN
INTRAMUSCULAR | Status: AC
  Filled 2021-08-27: qty 3

## 2021-08-27 MED ORDER — DILTIAZEM HCL ER COATED BEADS 120 MG PO CP24
120.0000 mg | ORAL_CAPSULE | Freq: Every day | ORAL | Status: DC
  Administered 2021-08-28 – 2021-08-29 (×2): 120 mg via ORAL
  Filled 2021-08-27 (×2): qty 1

## 2021-08-27 MED ORDER — EPHEDRINE 5 MG/ML INJ
INTRAVENOUS | Status: AC
  Filled 2021-08-27: qty 10

## 2021-08-27 MED ORDER — FENTANYL CITRATE (PF) 100 MCG/2ML IJ SOLN
INTRAMUSCULAR | Status: DC | PRN
  Administered 2021-08-27 (×2): 25 ug via INTRAVENOUS

## 2021-08-27 MED ORDER — SODIUM CHLORIDE 0.9 % IV SOLN
INTRAVENOUS | Status: AC
  Filled 2021-08-27: qty 2

## 2021-08-27 MED ORDER — SODIUM CHLORIDE 0.9 % IV SOLN: INTRAVENOUS | Status: DC

## 2021-08-27 MED ORDER — ALVIMOPAN 12 MG PO CAPS
ORAL_CAPSULE | ORAL | Status: AC
  Administered 2021-08-27: 12 mg via ORAL
  Filled 2021-08-27: qty 1

## 2021-08-27 MED ORDER — CELECOXIB 200 MG PO CAPS
ORAL_CAPSULE | ORAL | Status: AC
  Administered 2021-08-27: 200 mg via ORAL
  Filled 2021-08-27: qty 1

## 2021-08-27 MED ORDER — PANTOPRAZOLE SODIUM 40 MG IV SOLR
40.0000 mg | Freq: Every day | INTRAVENOUS | Status: DC
  Administered 2021-08-27 – 2021-08-28 (×2): 40 mg via INTRAVENOUS
  Filled 2021-08-27 (×2): qty 40

## 2021-08-27 MED ORDER — LOSARTAN POTASSIUM 50 MG PO TABS
50.0000 mg | ORAL_TABLET | Freq: Every day | ORAL | Status: DC
  Administered 2021-08-28 – 2021-08-29 (×2): 50 mg via ORAL
  Filled 2021-08-27 (×2): qty 1

## 2021-08-27 MED ORDER — EPHEDRINE SULFATE 50 MG/ML IJ SOLN
INTRAMUSCULAR | Status: DC | PRN
  Administered 2021-08-27 (×2): 10 mg via INTRAVENOUS
  Administered 2021-08-27: 5 mg via INTRAVENOUS

## 2021-08-27 MED ORDER — MORPHINE SULFATE (PF) 4 MG/ML IV SOLN: 4.0000 mg | INTRAVENOUS | Status: DC | PRN

## 2021-08-27 MED ORDER — SODIUM CHLORIDE (PF) 0.9 % IJ SOLN
INTRAMUSCULAR | Status: AC
  Filled 2021-08-27: qty 50

## 2021-08-27 MED ORDER — OXYCODONE HCL 5 MG/5ML PO SOLN: 5.0000 mg | Freq: Once | ORAL | Status: DC | PRN

## 2021-08-27 MED ORDER — HYDROCODONE-ACETAMINOPHEN 5-325 MG PO TABS
1.0000 | ORAL_TABLET | ORAL | Status: DC | PRN
  Administered 2021-08-28: 2 via ORAL
  Filled 2021-08-27: qty 2

## 2021-08-27 MED ORDER — INDOCYANINE GREEN 25 MG IV SOLR
INTRAVENOUS | Status: DC | PRN
Start: 2021-08-27 — End: 2021-08-27
  Administered 2021-08-27: 2.5 mg via INTRAVENOUS
  Administered 2021-08-27: 5 mg via INTRAVENOUS
  Administered 2021-08-27: 12.5 mg via INTRAVENOUS

## 2021-08-27 MED ORDER — KETAMINE HCL 50 MG/5ML IJ SOSY
PREFILLED_SYRINGE | INTRAMUSCULAR | Status: AC
  Filled 2021-08-27: qty 5

## 2021-08-27 MED ORDER — SODIUM CHLORIDE (PF) 0.9 % IJ SOLN
INTRAMUSCULAR | Status: DC | PRN
  Administered 2021-08-27: 100 mL

## 2021-08-27 MED ORDER — ONDANSETRON HCL 4 MG/2ML IJ SOLN
INTRAMUSCULAR | Status: DC | PRN
Start: 2021-08-27 — End: 2021-08-27
  Administered 2021-08-27: 4 mg via INTRAVENOUS

## 2021-08-27 MED ORDER — ROCURONIUM BROMIDE 10 MG/ML (PF) SYRINGE
PREFILLED_SYRINGE | INTRAVENOUS | Status: AC
  Filled 2021-08-27: qty 20

## 2021-08-27 MED ORDER — ACETAMINOPHEN 500 MG PO TABS
ORAL_TABLET | ORAL | Status: AC
  Administered 2021-08-27: 1000 mg via ORAL
  Filled 2021-08-27: qty 2

## 2021-08-27 MED ORDER — DEXAMETHASONE SODIUM PHOSPHATE 10 MG/ML IJ SOLN
INTRAMUSCULAR | Status: AC
  Filled 2021-08-27: qty 1

## 2021-08-27 MED ORDER — PROPOFOL 10 MG/ML IV BOLUS
INTRAVENOUS | Status: AC
  Filled 2021-08-27: qty 20

## 2021-08-27 MED ORDER — OXYCODONE HCL 5 MG PO TABS: 5.0000 mg | ORAL_TABLET | Freq: Once | ORAL | Status: DC | PRN

## 2021-08-27 MED ORDER — FENTANYL CITRATE (PF) 100 MCG/2ML IJ SOLN
INTRAMUSCULAR | Status: AC
  Filled 2021-08-27: qty 2

## 2021-08-27 MED ORDER — ALVIMOPAN 12 MG PO CAPS
12.0000 mg | ORAL_CAPSULE | Freq: Two times a day (BID) | ORAL | Status: DC
  Administered 2021-08-28 (×2): 12 mg via ORAL
  Filled 2021-08-27 (×3): qty 1

## 2021-08-27 MED ORDER — KETAMINE HCL 10 MG/ML IJ SOLN
INTRAMUSCULAR | Status: DC | PRN
  Administered 2021-08-27 (×3): 10 mg via INTRAVENOUS
  Administered 2021-08-27: 20 mg via INTRAVENOUS

## 2021-08-27 MED ORDER — 0.9 % SODIUM CHLORIDE (POUR BTL) OPTIME
TOPICAL | Status: DC | PRN
  Administered 2021-08-27: 1000 mL

## 2021-08-27 MED ORDER — LIDOCAINE HCL (PF) 2 % IJ SOLN
INTRAMUSCULAR | Status: AC
  Filled 2021-08-27: qty 5

## 2021-08-27 MED ORDER — GABAPENTIN 300 MG PO CAPS
ORAL_CAPSULE | ORAL | Status: AC
  Administered 2021-08-27: 300 mg via ORAL
  Filled 2021-08-27: qty 1

## 2021-08-27 MED ORDER — MIDAZOLAM HCL 2 MG/2ML IJ SOLN
INTRAMUSCULAR | Status: AC
  Filled 2021-08-27: qty 2

## 2021-08-27 MED ORDER — PHENYLEPHRINE HCL (PRESSORS) 10 MG/ML IV SOLN
INTRAVENOUS | Status: AC
  Filled 2021-08-27: qty 1

## 2021-08-27 MED ORDER — BUPIVACAINE HCL (PF) 0.5 % IJ SOLN
INTRAMUSCULAR | Status: AC
  Filled 2021-08-27: qty 30

## 2021-08-27 MED ORDER — DEXAMETHASONE SODIUM PHOSPHATE 10 MG/ML IJ SOLN
INTRAMUSCULAR | Status: DC | PRN
  Administered 2021-08-27: 10 mg via INTRAVENOUS

## 2021-08-27 MED ORDER — BUPIVACAINE-EPINEPHRINE (PF) 0.5% -1:200000 IJ SOLN
INTRAMUSCULAR | Status: AC
  Filled 2021-08-27: qty 30

## 2021-08-27 MED ORDER — SODIUM CHLORIDE 0.9 % IV SOLN
INTRAVENOUS | Status: DC | PRN
  Administered 2021-08-27: 30 ug/min via INTRAVENOUS

## 2021-08-27 MED ORDER — ACETAMINOPHEN 10 MG/ML IV SOLN: 1000.0000 mg | Freq: Once | INTRAVENOUS | Status: DC | PRN

## 2021-08-27 MED ORDER — LIDOCAINE HCL (CARDIAC) PF 100 MG/5ML IV SOSY
PREFILLED_SYRINGE | INTRAVENOUS | Status: DC | PRN
  Administered 2021-08-27: 100 mg via INTRAVENOUS

## 2021-08-27 MED ORDER — DEXMEDETOMIDINE (PRECEDEX) IN NS 20 MCG/5ML (4 MCG/ML) IV SYRINGE
PREFILLED_SYRINGE | INTRAVENOUS | Status: AC
  Filled 2021-08-27: qty 5

## 2021-08-27 MED ORDER — VASOPRESSIN 20 UNIT/ML IV SOLN
INTRAVENOUS | Status: DC | PRN
  Administered 2021-08-27 (×5): 1 [IU] via INTRAVENOUS

## 2021-08-27 MED ORDER — SUGAMMADEX SODIUM 500 MG/5ML IV SOLN
INTRAVENOUS | Status: DC | PRN
  Administered 2021-08-27: 200 mg via INTRAVENOUS

## 2021-08-27 MED ORDER — ONDANSETRON HCL 4 MG/2ML IJ SOLN: 4.0000 mg | Freq: Once | INTRAMUSCULAR | Status: DC | PRN

## 2021-08-27 MED ORDER — SODIUM CHLORIDE (PF) 0.9 % IJ SOLN
INTRAMUSCULAR | Status: AC
  Filled 2021-08-27: qty 20

## 2021-08-27 MED ORDER — GLYCOPYRROLATE 0.2 MG/ML IJ SOLN
INTRAMUSCULAR | Status: DC | PRN
  Administered 2021-08-27 (×3): .2 mg via INTRAVENOUS

## 2021-08-27 MED ORDER — FENTANYL CITRATE (PF) 100 MCG/2ML IJ SOLN: 25.0000 ug | INTRAMUSCULAR | Status: DC | PRN

## 2021-08-27 MED ORDER — GABAPENTIN 300 MG PO CAPS
300.0000 mg | ORAL_CAPSULE | Freq: Two times a day (BID) | ORAL | Status: DC
  Administered 2021-08-27 – 2021-08-29 (×4): 300 mg via ORAL
  Filled 2021-08-27 (×4): qty 1

## 2021-08-27 MED ORDER — METOPROLOL SUCCINATE ER 25 MG PO TB24
25.0000 mg | ORAL_TABLET | Freq: Every day | ORAL | Status: DC
  Administered 2021-08-28 – 2021-08-29 (×2): 25 mg via ORAL
  Filled 2021-08-27 (×2): qty 1

## 2021-08-27 MED ORDER — FAMOTIDINE 20 MG PO TABS
ORAL_TABLET | ORAL | Status: AC
  Administered 2021-08-27: 20 mg via ORAL
  Filled 2021-08-27: qty 1

## 2021-08-27 MED ORDER — ROCURONIUM BROMIDE 100 MG/10ML IV SOLN
INTRAVENOUS | Status: DC | PRN
  Administered 2021-08-27: 30 mg via INTRAVENOUS
  Administered 2021-08-27: 100 mg via INTRAVENOUS

## 2021-08-27 MED ORDER — MIDAZOLAM HCL 2 MG/2ML IJ SOLN
INTRAMUSCULAR | Status: DC | PRN
  Administered 2021-08-27: 2 mg via INTRAVENOUS

## 2021-08-27 MED ORDER — SODIUM CHLORIDE 0.9 % IV SOLN
2.0000 g | Freq: Two times a day (BID) | INTRAVENOUS | Status: AC
  Administered 2021-08-27 – 2021-08-28 (×2): 2 g via INTRAVENOUS
  Filled 2021-08-27 (×2): qty 2

## 2021-08-27 MED ORDER — FLECAINIDE ACETATE 100 MG PO TABS
100.0000 mg | ORAL_TABLET | Freq: Two times a day (BID) | ORAL | Status: DC
  Administered 2021-08-27 – 2021-08-29 (×4): 100 mg via ORAL
  Filled 2021-08-27 (×5): qty 1

## 2021-08-27 MED ORDER — EPINEPHRINE PF 1 MG/ML IJ SOLN
INTRAMUSCULAR | Status: AC
  Filled 2021-08-27: qty 1

## 2021-08-27 SURGICAL SUPPLY — 99 items
BLADE CLIPPER SURG (BLADE) ×3 IMPLANT
BLADE SURG SZ10 CARB STEEL (BLADE) ×3 IMPLANT
BLADE SURG SZ11 CARB STEEL (BLADE) ×3 IMPLANT
CANNULA REDUC XI 12-8 STAPL (CANNULA) ×1
CANNULA REDUCER 12-8 DVNC XI (CANNULA) ×2 IMPLANT
CHLORAPREP W/TINT 26 (MISCELLANEOUS) ×3 IMPLANT
CLIP LIGATING HEMO O LOK GREEN (MISCELLANEOUS) IMPLANT
COVER TIP SHEARS 8 DVNC (MISCELLANEOUS) ×2 IMPLANT
COVER TIP SHEARS 8MM DA VINCI (MISCELLANEOUS) ×1
DEFOGGER SCOPE WARMER CLEARIFY (MISCELLANEOUS) ×3 IMPLANT
DERMABOND ADVANCED (GAUZE/BANDAGES/DRESSINGS)
DERMABOND ADVANCED .7 DNX12 (GAUZE/BANDAGES/DRESSINGS) IMPLANT
DRAPE ARM DVNC X/XI (DISPOSABLE) ×8 IMPLANT
DRAPE COLUMN DVNC XI (DISPOSABLE) ×2 IMPLANT
DRAPE DA VINCI XI ARM (DISPOSABLE) ×4
DRAPE DA VINCI XI COLUMN (DISPOSABLE) ×1
DRAPE LEGGINS SURG 28X43 STRL (DRAPES) ×3 IMPLANT
DRAPE UNDER BUTTOCK W/FLU (DRAPES) ×3 IMPLANT
DRSG OPSITE POSTOP 3X4 (GAUZE/BANDAGES/DRESSINGS) ×3 IMPLANT
DRSG OPSITE POSTOP 4X10 (GAUZE/BANDAGES/DRESSINGS) IMPLANT
DRSG OPSITE POSTOP 4X8 (GAUZE/BANDAGES/DRESSINGS) IMPLANT
ELECT BLADE 6.5 EXT (BLADE) IMPLANT
ELECT CAUTERY BLADE 6.4 (BLADE) ×3 IMPLANT
ELECT REM PT RETURN 9FT ADLT (ELECTROSURGICAL) ×3
ELECTRODE REM PT RTRN 9FT ADLT (ELECTROSURGICAL) ×2 IMPLANT
GAUZE 4X4 16PLY ~~LOC~~+RFID DBL (SPONGE) ×3 IMPLANT
GLOVE SURG ENC MOIS LTX SZ6.5 (GLOVE) ×9 IMPLANT
GLOVE SURG UNDER POLY LF SZ6.5 (GLOVE) ×9 IMPLANT
GOWN STRL REUS W/ TWL LRG LVL3 (GOWN DISPOSABLE) ×12 IMPLANT
GOWN STRL REUS W/TWL LRG LVL3 (GOWN DISPOSABLE) ×6
GRASPER SUT TROCAR 14GX15 (MISCELLANEOUS) IMPLANT
HANDLE YANKAUER SUCT BULB TIP (MISCELLANEOUS) ×3 IMPLANT
IRRIGATION STRYKERFLOW (MISCELLANEOUS) ×2 IMPLANT
IRRIGATOR STRYKERFLOW (MISCELLANEOUS) ×3
IRRIGATOR SUCT 8 DISP DVNC XI (IRRIGATION / IRRIGATOR) IMPLANT
IRRIGATOR SUCTION 8MM XI DISP (IRRIGATION / IRRIGATOR)
IV NS 1000ML (IV SOLUTION) ×1
IV NS 1000ML BAXH (IV SOLUTION) ×2 IMPLANT
KIT IMAGING PINPOINTPAQ (MISCELLANEOUS) ×3 IMPLANT
KIT PINK PAD W/HEAD ARE REST (MISCELLANEOUS) ×3
KIT PINK PAD W/HEAD ARM REST (MISCELLANEOUS) ×2 IMPLANT
LABEL OR SOLS (LABEL) IMPLANT
MANIFOLD NEPTUNE II (INSTRUMENTS) ×3 IMPLANT
NEEDLE HYPO 22GX1.5 SAFETY (NEEDLE) ×3 IMPLANT
NEEDLE INSUFFLATION 14GA 120MM (NEEDLE) ×3 IMPLANT
OBTURATOR OPTICAL STANDARD 8MM (TROCAR) ×1
OBTURATOR OPTICAL STND 8 DVNC (TROCAR) ×2
OBTURATOR OPTICALSTD 8 DVNC (TROCAR) ×2 IMPLANT
PACK COLON CLEAN CLOSURE (MISCELLANEOUS) ×3 IMPLANT
PACK LAP CHOLECYSTECTOMY (MISCELLANEOUS) ×3 IMPLANT
PENCIL ELECTRO HAND CTR (MISCELLANEOUS) ×3 IMPLANT
PORT ACCESS TROCAR AIRSEAL 5 (TROCAR) ×3 IMPLANT
RELOAD STAPLER 3.5X45 BLU DVNC (STAPLE) IMPLANT
RELOAD STAPLER 3.5X60 BLU DVNC (STAPLE) ×6 IMPLANT
RETRACTOR RING XSMALL (MISCELLANEOUS) IMPLANT
RETRACTOR WOUND ALXS 18CM SML (MISCELLANEOUS) ×2 IMPLANT
RTRCTR WOUND ALEXIS 13CM XS SH (MISCELLANEOUS)
RTRCTR WOUND ALEXIS O 18CM SML (MISCELLANEOUS) ×3
SEAL CANN UNIV 5-8 DVNC XI (MISCELLANEOUS) ×6 IMPLANT
SEAL XI 5MM-8MM UNIVERSAL (MISCELLANEOUS) ×3
SEALER VESSEL DA VINCI XI (MISCELLANEOUS)
SEALER VESSEL EXT DVNC XI (MISCELLANEOUS) IMPLANT
SET TRI-LUMEN FLTR TB AIRSEAL (TUBING) ×6 IMPLANT
SOL PREP PVP 2OZ (MISCELLANEOUS) ×3
SOLUTION ELECTROLUBE (MISCELLANEOUS) ×3 IMPLANT
SOLUTION PREP PVP 2OZ (MISCELLANEOUS) ×2 IMPLANT
SPONGE T-LAP 18X18 ~~LOC~~+RFID (SPONGE) ×3 IMPLANT
SPONGE T-LAP 4X18 ~~LOC~~+RFID (SPONGE) ×3 IMPLANT
STAPLER 45 DA VINCI SURE FORM (STAPLE)
STAPLER 45 SUREFORM DVNC (STAPLE) IMPLANT
STAPLER 60 DA VINCI SURE FORM (STAPLE) ×1
STAPLER 60 SUREFORM DVNC (STAPLE) ×2 IMPLANT
STAPLER CANNULA SEAL DVNC XI (STAPLE) ×2 IMPLANT
STAPLER CANNULA SEAL XI (STAPLE) ×1
STAPLER CIRCULAR MANUAL XL 29 (STAPLE) ×3 IMPLANT
STAPLER RELOAD 3.5X45 BLU DVNC (STAPLE)
STAPLER RELOAD 3.5X45 BLUE (STAPLE)
STAPLER RELOAD 3.5X60 BLU DVNC (STAPLE) ×6
STAPLER RELOAD 3.5X60 BLUE (STAPLE) ×3
STAPLER SYS INTERNAL RELOAD SS (MISCELLANEOUS) ×3 IMPLANT
SURGILUBE 2OZ TUBE FLIPTOP (MISCELLANEOUS) ×3 IMPLANT
SUT MNCRL 4-0 (SUTURE) ×2
SUT MNCRL 4-0 27XMFL (SUTURE) ×4
SUT PDS PLUS 0 (SUTURE) ×2
SUT PDS PLUS AB 0 CT-2 (SUTURE) ×4 IMPLANT
SUT SILK 0 SH 30 (SUTURE) ×6 IMPLANT
SUT SILK 3-0 (SUTURE) IMPLANT
SUT STRATAFIX PDS 30 CT-1 (SUTURE) ×3 IMPLANT
SUT VIC AB 2-0 UR6 27 (SUTURE) ×3 IMPLANT
SUT VIC AB 3-0 SH 27 (SUTURE) ×4
SUT VIC AB 3-0 SH 27X BRD (SUTURE) ×8 IMPLANT
SUT VICRYL 0 AB UR-6 (SUTURE) ×6 IMPLANT
SUT VLOC 90 6 CV-15 VIOLET (SUTURE) ×3 IMPLANT
SUTURE MNCRL 4-0 27XMF (SUTURE) ×4 IMPLANT
SYR 30ML LL (SYRINGE) ×6 IMPLANT
SYS TROCAR 1.5-3 SLV ABD GEL (ENDOMECHANICALS) ×3
SYSTEM TROCR 1.5-3 SLV ABD GEL (ENDOMECHANICALS) ×2 IMPLANT
TRAY FOLEY MTR SLVR 16FR STAT (SET/KITS/TRAYS/PACK) ×3 IMPLANT
WATER STERILE IRR 500ML POUR (IV SOLUTION) ×3 IMPLANT

## 2021-08-27 NOTE — Interval H&P Note (Signed)
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  The various methods of treatment have been discussed with the patient and family. After consideration of risks, benefits and other options for treatment, the patient has consented to  Procedure(s): XI ROBOT ASSISTED SIGMOID COLECTOMY (N/A) as a surgical intervention.  The patient's history has been reviewed, patient examined, no change in status, stable for surgery.  I have reviewed the patient's chart and labs.  Questions were answered to the patient's satisfaction.     Herbert Pun

## 2021-08-27 NOTE — Anesthesia Procedure Notes (Signed)
Procedure Name: Intubation Date/Time: 08/27/2021 9:10 AM Performed by: Debe Coder, CRNA Pre-anesthesia Checklist: Patient identified, Emergency Drugs available, Suction available and Patient being monitored Patient Re-evaluated:Patient Re-evaluated prior to induction Oxygen Delivery Method: Circle system utilized Preoxygenation: Pre-oxygenation with 100% oxygen Induction Type: IV induction Ventilation: Mask ventilation without difficulty Laryngoscope Size: Mac and 4 Grade View: Grade II Tube type: Oral Tube size: 7.5 mm Number of attempts: 1 Airway Equipment and Method: Stylet and Oral airway Placement Confirmation: ETT inserted through vocal cords under direct vision, positive ETCO2 and breath sounds checked- equal and bilateral Secured at: 22 cm Tube secured with: Tape Dental Injury: Teeth and Oropharynx as per pre-operative assessment

## 2021-08-27 NOTE — Anesthesia Preprocedure Evaluation (Signed)
Anesthesia Evaluation  Patient identified by MRN, date of birth, ID band Patient awake    Reviewed: Allergy & Precautions, NPO status , Patient's Chart, lab work & pertinent test results  History of Anesthesia Complications Negative for: history of anesthetic complications  Airway Mallampati: II  TM Distance: >3 FB Neck ROM: Full    Dental  (+) Poor Dentition, Caps   Pulmonary neg pulmonary ROS, neg sleep apnea, neg COPD, Patient abstained from smoking.Not current smoker,    Pulmonary exam normal breath sounds clear to auscultation       Cardiovascular Exercise Tolerance: Good METShypertension, + CAD  (-) Past MI + dysrhythmias Atrial Fibrillation  Rhythm:Regular Rate:Normal - Systolic murmurs    Neuro/Psych negative neurological ROS  negative psych ROS   GI/Hepatic GERD  Controlled,(+)     (-) substance abuse  ,   Endo/Other  neg diabetes  Renal/GU negative Renal ROS     Musculoskeletal   Abdominal   Peds  Hematology   Anesthesia Other Findings Past Medical History: No date: Acid reflux 08/06/2021: Adenocarcinoma of colon ([place])     Comment:  a.) CT on 07/10/2021 --> 20-25 cm from anal verge;               measured 3.7 x 2.7 cm.  b.) pathology (+) for invasive               moderately differentiated adenocarcinoma. No date: Aortic atherosclerosis ([place]) No date: Atherosclerosis of coronary artery     Comment:  a.) CT chest on 08/14/2021 --> moderate. No date: Atrial fibrillation and flutter ([name])     Comment:  a.) CHA2DS2VASc = 3 (age, HTN, aortic plaque); on               apixaban. No date: Chronic anticoagulation     Comment:  a.) Apixaban No date: Hypertension No date: Lesion of right lobe of liver     Comment:  a.) CT on 07/10/2021 --> 1.0 cm and 0.8 cm lesions in               anterior RIGHT lobe. No date: LVH (left ventricular hypertrophy)     Comment:  a.) TTE on 09/16/2017 - mild No date:  Prostate cancer ([place]) No date: Valvular regurgitation     Comment:  a.) TTE on 09/23/2017 --> LVEF 55%; mild MV, TV, PV  Reproductive/Obstetrics                             Anesthesia Physical Anesthesia Plan  ASA: 3  Anesthesia Plan: General   Post-op Pain Management:    Induction: Intravenous  PONV Risk Score and Plan: 4 or greater and Ondansetron, Dexamethasone, Treatment may vary due to age or medical condition and Midazolam  Airway Management Planned: Oral ETT  Additional Equipment: None  Intra-op Plan:   Post-operative Plan: Extubation in OR  Informed Consent: I have reviewed the patients History and Physical, chart, labs and discussed the procedure including the risks, benefits and alternatives for the proposed anesthesia with the patient or authorized representative who has indicated his/her understanding and acceptance.     Dental advisory given  Plan Discussed with: CRNA and Surgeon  Anesthesia Plan Comments: (Discussed risks of anesthesia with patient, including PONV, sore throat, lip/dental damage. Rare risks discussed as well, such as cardiorespiratory and neurological sequelae, and allergic reactions. Patient understands.)        Anesthesia Quick Evaluation

## 2021-08-27 NOTE — Transfer of Care (Signed)
Immediate Anesthesia Transfer of Care Note  Patient: [name]  Procedure(s) Performed: XI ROBOT ASSISTED SIGMOID COLECTOMY (Abdomen) INDOCYANINE GREEN FLUORESCENCE IMAGING (ICG)  Patient Location: PACU  Anesthesia Type:General  Level of Consciousness: awake and drowsy  Airway & Oxygen Therapy: Patient Spontanous Breathing  Post-op Assessment: Report given to RN and Post -op Vital signs reviewed and stable  Post vital signs: Reviewed and stable  Last Vitals:  Vitals Value Taken Time  BP 115/67 08/27/21 1346  Temp    Pulse 59 08/27/21 1346  Resp 14 08/27/21 1346  SpO2 98 % 08/27/21 1346    Last Pain:  Vitals:   08/27/21 0829  TempSrc: Oral  PainSc: 0-No pain         Complications: No notable events documented.

## 2021-08-27 NOTE — Op Note (Signed)
Preoperative diagnosis: Sigmoid Cancer  Postoperative diagnosis: Sigmoid Cancer  Procedure: Robotic assisted laparoscopic sigmoid colectomy.   Anesthesia: GETA   Surgeon: Herbert Pun, MD  Assistant: Dr. Lysle Pearl    Wound Classification: Clean contaminated   Specimen: Sigmoid colon   Complications: None   Estimated Blood Loss: 10 mL   Indications: Patient is a 70 y.o. 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 Elective resection was indicated.     FIndings: 1.  Sigmoid colon mass with tattoo 2.  Adequate hemostasis.  3.  No gross metastasis noted 4.  Adequate ICG perfusion at the anastomosis    Description of procedure: The patient was placed on the operating table in the lithotomy position, both arms tucked. General anesthesia was induced. A time-out was completed verifying correct patient, procedure, site, positioning, and implant(s) and/or special equipment prior to beginning this procedure. The abdomen was prepped and draped in the usual sterile fashion.    A Veress needle was inserted on Palmer's point.  Abdominal cavity was insufflated to 15 mmHg. Patient tolerated insufflation well.  An 8 mm port was inserted in an Optiview fashion in the right upper quadrant.  Two additional 8 mm ports and one 12 mm port were inserted under direct visualization along the right side of the abdominal wall. 5 mm assistant port was then placed on the right subcostal area.  No injuries from trocar placements were noted. The table was placed in the Trendelenburg position.  Xi robotic platform was then brought to the operative field and docked at an angle from the left lower quadrant.  Tip up grasper and fenestrated bipolar were placed in the left arm ports.  Scissors were placed in right arm port.   Examination of the abdominal cavity noted no signs of gross metastasis.  The sigmoid colon with inking was noted.    The sigmoid colon was  elevated in the mesentery above the sacral promontory was identified.  Dissection of that mesentery was started with scissors.  Through the avascular plane medial to lateral dissection was done until the ureter was identified.  Dissection continued proximally until the inferior mesenteric artery was identified.  The inferior mesenteric artery was skeletonized and divided at its origin with vessel sealer.  The dissection of the mesentery was then done distally until the rectosigmoid junction.  The lateral attachments of the sigmoid and descending colon were dissected.  At this point the distal and proximal point of transection were identified.  ICG green fluorescein was flush to evaluate perfusion to the area of the vision.  Adequate perfusion was identified.  The proximal and distal sigmoid was divided with stapler.  And Pfannenstiel incision was done.  The sigmoid colon was extracted.  The descending colon was exteriorized.  The staple line was open the Ambien was placed and fixated with a pursestring.  The descending colon was again inserted into the abdomen.  The abdominal cavity was again insufflated.   Once the descending colon was identified reaching the rectum without tension, the assistant surgeon introduced the 29 mm EEA rectally. It was guided under direct vision up to the level of the distal staple line on the rectal stump. The spike of the EEA device was then deployed to pierce the rectal stump, the anvil was then attached to this spike, and the EEA device is closed and fired to perform a stapled end-to-end anastomosis. The doughnuts produced by the EEA stapler were checked to ensure that they were  complete.  ICG green was again flushed for evaluation of flap pedicle perfusion.  The colonic pedicle was seen to be adequately perfused and the anastomosis.  Furthermore, an air leak test was carried out by insufflating air gently into the rectum, while the anastomosis was bathed underwater. A clamp was  placed on the proximal colon to prevent its distention. Once the anastomosis was properly tested, the patient was repositioned back in the normal anatomical position. The trocars were removed under direct vision and the fascia of the Pfannenstiel incision was closed with a #0 PDS suture. The skin was closed with 4-0 Monocryl sutures in a running fashion and a dry sterile dressing is applied. The sponge and instrument count were correct, blood loss was minimal, and there were no complications.    The patient tolerated the procedure well, awakened from anesthesia and was taken to the postanesthesia care unit in satisfactory condition.  Foley still in place.  Sponge count and instrument count correct at the end of the procedure.

## 2021-08-28 LAB — CBC
HCT: 31.1 % — ABNORMAL LOW (ref 39.0–52.0)
Hemoglobin: 11 g/dL — ABNORMAL LOW (ref 13.0–17.0)
MCH: 34 pg (ref 26.0–34.0)
MCHC: 35.4 g/dL (ref 30.0–36.0)
MCV: 96 fL (ref 80.0–100.0)
Platelets: 242 10*3/uL (ref 150–400)
RBC: 3.24 MIL/uL — ABNORMAL LOW (ref 4.22–5.81)
RDW: 12.1 % (ref 11.5–15.5)
WBC: 14.5 10*3/uL — ABNORMAL HIGH (ref 4.0–10.5)
nRBC: 0 % (ref 0.0–0.2)

## 2021-08-28 LAB — BASIC METABOLIC PANEL
Anion gap: 7 (ref 5–15)
BUN: 9 mg/dL (ref 8–23)
CO2: 25 mmol/L (ref 22–32)
Calcium: 8.8 mg/dL — ABNORMAL LOW (ref 8.9–10.3)
Chloride: 100 mmol/L (ref 98–111)
Creatinine, Ser: 1.02 mg/dL (ref 0.61–1.24)
GFR, Estimated: >60 mL/min (ref >60)
Glucose, Bld: 118 mg/dL — ABNORMAL HIGH (ref 70–99)
Potassium: 4.3 mmol/L (ref 3.5–5.1)
Sodium: 132 mmol/L — ABNORMAL LOW (ref 135–145)

## 2021-08-28 LAB — PHOSPHORUS: Phosphorus: 5 mg/dL — ABNORMAL HIGH (ref 2.5–4.6)

## 2021-08-28 LAB — MAGNESIUM: Magnesium: 1.6 mg/dL — ABNORMAL LOW (ref 1.7–2.4)

## 2021-08-28 NOTE — Anesthesia Postprocedure Evaluation (Signed)
Anesthesia Post Note  Patient: Edwin [name]  Procedure(s) Performed: XI ROBOT ASSISTED SIGMOID COLECTOMY (Abdomen) INDOCYANINE GREEN FLUORESCENCE IMAGING (ICG)  Patient location during evaluation: PACU Anesthesia Type: General Level of consciousness: awake and alert Pain management: pain level controlled Vital Signs Assessment: post-procedure vital signs reviewed and stable Respiratory status: spontaneous breathing, nonlabored ventilation, respiratory function stable and patient connected to nasal cannula oxygen Cardiovascular status: blood pressure returned to baseline and stable Postop Assessment: no apparent nausea or vomiting Anesthetic complications: no   No notable events documented.   Last Vitals:  Vitals:   08/28/21 0837 08/28/21 1552  BP: 127/74 117/71  Pulse: 60 (!) 52  Resp: 15 17  Temp: 37 C 36.6 C  SpO2: 98% 100%    Last Pain:  Vitals:   08/28/21 1552  TempSrc: Oral  PainSc:                  Arita Miss

## 2021-08-28 NOTE — Progress Notes (Signed)
Patient ID: Edwin [name], male   DOB: 21-Dec-1950, 70 y.o.   MRN: [id number]     [place] [facility] Day(s): 1.   Interval History: Patient seen and examined, no acute events or new complaints overnight. Patient reports feeling well.  He denies any complaint.  He feels that he is in a passing gas.  Vital signs in last 24 hours: [min-max] current  Temp:  [96.3 F (35.7 C)-99.5 F (37.5 C)] 98.6 F (37 C) (09/29 0837) Pulse Rate:  [54-65] 60 (09/29 0837) Resp:  [7-18] 15 (09/29 0837) BP: (103-127)/(61-76) 127/74 (09/29 0837) SpO2:  [92 %-100 %] 98 % (09/29 0837)     Height: 6\' 1"  (185.4 cm) Weight: 85.3 kg BMI (Calculated): 24.81   Physical Exam:  Constitutional: alert, cooperative and no distress  Respiratory: breathing non-labored at rest  Cardiovascular: regular rate and sinus rhythm  Gastrointestinal: soft, non-tender, and non-distended  Labs:  CBC Latest Ref Rng & Units 08/28/2021 08/25/2021 03/17/2020  WBC 4.0 - 10.5 K/uL 14.5(H) 6.0 4.2  Hemoglobin 13.0 - 17.0 g/dL 11.0(L) 12.8(L) 12.5(L)  Hematocrit 39.0 - 52.0 % 31.1(L) 36.0(L) 36.0(L)  Platelets 150 - 400 K/uL 242 289 161   CMP Latest Ref Rng & Units 08/28/2021 08/25/2021 07/10/2021  Glucose 70 - 99 mg/dL 118(H) 126(H) -  BUN 8 - 23 mg/dL 9 9 -  Creatinine 0.61 - 1.24 mg/dL 1.02 1.03 1.00  Sodium 135 - 145 mmol/L 132(L) 132(L) -  Potassium 3.5 - 5.1 mmol/L 4.3 3.7 -  Chloride 98 - 111 mmol/L 100 98 -  CO2 22 - 32 mmol/L 25 27 -  Calcium 8.9 - 10.3 mg/dL 8.8(L) 9.6 -    Imaging studies: No new pertinent imaging studies   Assessment/Plan:  70 y.o. male with sigmoid cancer 1 Day Post-Op s/p robotic assisted laparoscopic sigmoid colectomy.  Patient today with stable vital signs.  Pain controlled.  There is bowel sounds present.  We will continue with current postoperative care.  Will advance diet to full liquids.  We will continue with pain management.  Foley catheter out today.  Encouraged to  ambulate.  Arnold Long, MD

## 2021-08-29 MED ORDER — HYDROCODONE-ACETAMINOPHEN 5-325 MG PO TABS: 1.0000 | ORAL_TABLET | ORAL | 0 refills | Status: AC | PRN

## 2021-08-29 NOTE — Care Management Important Message (Signed)
Important Message  Patient Details  Name: Edwin [name] MRN: [id number] Date of Birth: November 20, 1951   Medicare Important Message Given:  N/A - LOS <3 / Initial given by admissions     Dannette Barbara 08/29/2021, 9:50 AM

## 2021-08-29 NOTE — Plan of Care (Signed)
Discharge teaching completed with patient who is in stable condition. 

## 2021-08-29 NOTE — TOC CM/SW Note (Signed)
Patient has orders to discharge home today. Chart reviewed. PCP is Benita Stabile, MD. On room air. Has incision on mid lower abdomen (dermabond). No TOC needs identified. CSW signing off.  Dayton Scrape, [place]

## 2021-08-29 NOTE — Discharge Summary (Signed)
  Patient ID: EERO DINI MRN: [id number] DOB/AGE: Sep 05, 1951 70 y.o.  Admit date: 08/27/2021 Discharge date: 08/29/2021   Discharge Diagnoses:  Active Problems:   Colon cancer [facility])   Procedures: Robotic assisted laparoscopic partial colectomy  Hospital Course: Patient with sigmoid cancer.  He underwent robotic assisted partial colectomy.  He tolerated the procedure well.  He has been ambulating, pain control, voiding spontaneously, having bowel movement, tolerating solid diet.  Wounds are dry and clean.  Physical Exam HENT:     Head: Normocephalic.  Cardiovascular:     Rate and Rhythm: Regular rhythm.  Pulmonary:     Effort: Pulmonary effort is normal.     Breath sounds: Normal breath sounds.  Abdominal:     General: Abdomen is flat.  Musculoskeletal:     Cervical back: Normal range of motion.  Skin:    Capillary Refill: Capillary refill takes less than 2 seconds.  Neurological:     Mental Status: He is alert and oriented to person, place, and time.     Consults: None  Disposition: Discharge disposition: 01-Home or Self Care       Discharge Instructions     Diet - low sodium heart healthy   Complete by: As directed    Increase activity slowly   Complete by: As directed       Allergies as of 08/29/2021   No Known Allergies      Medication List     TAKE these medications    calcium carbonate 500 MG chewable tablet Commonly known as: TUMS - dosed in mg elemental calcium Chew 1 tablet by mouth daily.   diltiazem 120 MG 24 hr capsule Commonly known as: CARDIZEM CD Take 120 mg by mouth daily.   doxazosin 4 MG tablet Commonly known as: CARDURA Take 4 mg by mouth every evening.   Eliquis 5 MG Tabs tablet Generic drug: apixaban Take 1 tablet (5 mg total) by mouth 2 (two) times daily.   flecainide 100 MG tablet Commonly known as: TAMBOCOR Take 100 mg by mouth 2 (two) times daily.   HYDROcodone-acetaminophen 5-325 MG tablet Commonly known as:  Norco Take 1 tablet by mouth every 4 (four) hours as needed for up to 3 days for moderate pain.   losartan 50 MG tablet Commonly known as: COZAAR Take 50 mg by mouth daily.   metoprolol succinate 25 MG 24 hr tablet Commonly known as: TOPROL-XL Take 25 mg by mouth at bedtime.        Follow-up Information     Herbert Pun, MD Follow up on 09/09/2021.   Specialty: General Surgery Contact information: [address] [place] [address] [phone]

## 2021-08-29 NOTE — Discharge Instructions (Signed)

## 2021-09-04 ENCOUNTER — Encounter: Payer: Self-pay | Admitting: Oncology

## 2021-09-04 ENCOUNTER — Inpatient Hospital Stay: Payer: Medicare Other

## 2021-09-04 ENCOUNTER — Other Ambulatory Visit: Payer: Self-pay

## 2021-09-04 ENCOUNTER — Inpatient Hospital Stay: Payer: Medicare Other | Attending: Oncology | Admitting: Oncology

## 2021-09-04 VITALS — BP 150/81 | HR 62 | Temp 97.6°F | Resp 18 | Ht 72.0 in | Wt 188.3 lb

## 2021-09-04 DIAGNOSIS — C187 Malignant neoplasm of sigmoid colon: Secondary | ICD-10-CM

## 2021-09-04 DIAGNOSIS — C61 Malignant neoplasm of prostate: Secondary | ICD-10-CM

## 2021-09-04 DIAGNOSIS — Z7189 Other specified counseling: Secondary | ICD-10-CM

## 2021-09-04 NOTE — Progress Notes (Signed)
Pt here to establish care.

## 2021-09-04 NOTE — Progress Notes (Signed)
Hematology/Oncology Consult note [facility] [phone] [phone]   Patient Care Team: Albina Billet, MD as PCP - General (Internal Medicine) Clent Jacks, RN as Oncology Nurse Navigator  REFERRING PROVIDER: Herbert Pun, *  CHIEF COMPLAINTS/REASON FOR VISIT:  Evaluation of sigmoid colon cancer  HISTORY OF PRESENTING ILLNESS:   Edwin [name] is a  70 y.o.  male with PMH listed below was seen in consultation at the request of  Herbert Pun, *  for evaluation of sigmoid colon cancer   # patient has history of prostate cancer diagnosed in July 2021.  06/06/2020 prostate biopsy showed adenocarcinoma, Gleason score 3+3 = 6 He follows up with urology Dr.Wolf and was recommended active surveillance.   07/02/2021 MR prostate w wo contrast  1. Colonic neoplasm likely distal sigmoid, difficult to localize due to limited imaging of the pelvis, potentially in sigmoid colon. PIRADS category 4 lesion at the LEFT prostate base. More defined on T2 than on previous imaging and more pronounced on diffusion. PIRADS category 4 lesion at the LEFT prostate apex is similar to prior imaging. Changes of BPH. No evidence of extracapsular extension, seminal vesicle invasion,or metastatic disease in the pelvis.  07/10/2021 CT abdomen pelvis w contrast showed 1. The colon is very redundant, particularly the sigmoid colon, and there is a fungating endoluminal mass at the left aspect of the sigmoid colon, approximately 20-25 cm from the anal verge, measuring at least 3.7 x 2.7 cm. Findings are consistent with primary colon malignancy. 2. No definite evidence of lymphadenopathy or metastatic disease in the abdomen or pelvis. 3. There are two small enhancing lesions of the anterior right lobe of the liver measuring 1.0 cm and 0.8 cm. These most likely reflect small flash filling hemangiomata, hepatic metastases not favored although not strictly  excluded   08/06/2021 colonoscopy showed sigmoid colon mass, hot snare showed invasive moderately differentiated adenocarcinoma, extended to inked and cauterized margin. Tumor is invasive at least into submucosa, with focal areas suspicious for muscularis propria invasion.   08/14/2021 chest chest w contrast showed No evidence of intrathoracic metastases 08/27/2021 patient underwent robotic assisted laparoscopic sigmoid colectomy. Pre operational CEA was 2.4, - [facility]  Pathology showed invasive colorectal adenocarcinoma, moderately differentiated, +LVI, + perineural invasion.  14 lymph nodes negative for malignancy.  There is 2 mesenteric foci of perineural invasion, - extramural perineural invasion and not as tumor deposits.  Patient was referred to establish care with oncology.  He reports feeling well today, no abdominal pain, nausea, vomiting, diarrhea, bloody stool  No unintentional weight loss.  He recall his mother had a history of small bowel resection, he does not know she had cancer or not.   Other medical conditions include A fib and he is on Eliquis 21m BID, CAD, LVH, prostate cancer, HTN,   Review of Systems  Constitutional:  Negative for appetite change, chills, fatigue, fever and unexpected weight change.  HENT:   Negative for hearing loss and voice change.   Eyes:  Negative for eye problems and icterus.  Respiratory:  Negative for chest tightness, cough and shortness of breath.   Cardiovascular:  Negative for chest pain and leg swelling.  Gastrointestinal:  Negative for abdominal distention and abdominal pain.  Endocrine: Negative for hot flashes.  Genitourinary:  Negative for difficulty urinating, dysuria and frequency.   Musculoskeletal:  Negative for arthralgias.  Skin:  Negative for itching and rash.  Neurological:  Negative for light-headedness and numbness.  Hematological:  Negative  for adenopathy. Does not bruise/bleed easily.  Psychiatric/Behavioral:   Negative for confusion.    MEDICAL HISTORY:  Past Medical History:  Diagnosis Date   Acid reflux    Adenocarcinoma of colon ([place]) 08/06/2021   a.) CT on 07/10/2021 --> 20-25 cm from anal verge; measured 3.7 x 2.7 cm.  b.) pathology (+) for invasive moderately differentiated adenocarcinoma.   Aortic atherosclerosis ([name])    Atherosclerosis of coronary artery    a.) CT chest on 08/14/2021 --> moderate.   Atrial fibrillation and flutter ([place])    a.) CHA2DS2VASc = 3 (age, HTN, aortic plaque); on apixaban.   Chronic anticoagulation    a.) Apixaban   Hypertension    Lesion of right lobe of liver    a.) CT on 07/10/2021 --> 1.0 cm and 0.8 cm lesions in anterior RIGHT lobe.   LVH (left ventricular hypertrophy)    a.) TTE on 09/16/2017 - mild   Prostate cancer ([place])    Valvular regurgitation    a.) TTE on 09/23/2017 --> LVEF 55%; mild MV, TV, PV    SURGICAL HISTORY: Past Surgical History:  Procedure Laterality Date   CARDIOVERSION N/A 11/10/2017   Procedure: CARDIOVERSION;  Surgeon: Teodoro Spray, MD;  Location: [facility];  Service: Cardiovascular;  Laterality: N/A;   CARDIOVERSION N/A 05/27/2018   Procedure: CARDIOVERSION;  Surgeon: Teodoro Spray, MD;  Location: [name];  Service: Cardiovascular;  Laterality: N/A;   COLONOSCOPY WITH PROPOFOL N/A 08/06/2021   Procedure: COLONOSCOPY WITH PROPOFOL;  Surgeon: Robert Bellow, MD;  Location: [facility];  Service: Endoscopy;  Laterality: N/A;   PROSTATE BIOPSY N/A 06/06/2020   Procedure: PROSTATE BIOPSY [name];  Surgeon: Royston Cowper, MD;  Location: [name];  Service: Urology;  Laterality: N/A;    SOCIAL HISTORY: Social History   Socioeconomic History   Marital status: Married    Spouse name: [name]   Number of children: Not on file   Years of education: Not on file   Highest education level: Not on file  Occupational History   Not on file  Tobacco Use   Smoking status: Never   Smokeless tobacco: Never  Vaping Use    Vaping Use: Never used  Substance and Sexual Activity   Alcohol use: Yes    Comment: approx 4 beers/ day   Drug use: No   Sexual activity: Not on file  Other Topics Concern   Not on file  Social History Narrative   Not on file   Social Determinants of Health   Financial Resource Strain: Not on file  Food Insecurity: Not on file  Transportation Needs: Not on file  Physical Activity: Not on file  Stress: Not on file  Social Connections: Not on file  Intimate Partner Violence: Not on file    FAMILY HISTORY: Family History  Problem Relation Age of Onset   Dementia Mother    Sudden death Father    Heart disease Brother     ALLERGIES:  has No Known Allergies.  MEDICATIONS:  Current Outpatient Medications  Medication Sig Dispense Refill   calcium carbonate (TUMS - DOSED IN MG ELEMENTAL CALCIUM) 500 MG chewable tablet Chew 1 tablet by mouth daily.     diltiazem (CARDIZEM CD) 120 MG 24 hr capsule Take 120 mg by mouth daily.   11   doxazosin (CARDURA) 4 MG tablet Take 4 mg by mouth every evening.     ELIQUIS 5 MG TABS tablet Take 1 tablet (5 mg total) by mouth  2 (two) times daily. 60 tablet 11   flecainide (TAMBOCOR) 100 MG tablet Take 100 mg by mouth 2 (two) times daily.     losartan (COZAAR) 50 MG tablet Take 50 mg by mouth daily.      metoprolol succinate (TOPROL-XL) 25 MG 24 hr tablet Take 25 mg by mouth at bedtime.      No current facility-administered medications for this visit.     PHYSICAL EXAMINATION: ECOG PERFORMANCE STATUS: 0 - Asymptomatic Vitals:   09/04/21 1505  BP: (!) 150/81  Pulse: 62  Resp: 18  Temp: 97.6 F (36.4 C)   Filed Weights   09/04/21 1505  Weight: 188 lb 4.8 oz (85.4 kg)    Physical Exam Constitutional:      General: He is not in acute distress. HENT:     Head: Normocephalic and atraumatic.  Eyes:     General: No scleral icterus. Cardiovascular:     Rate and Rhythm: Normal rate and regular rhythm.     Heart sounds: Normal heart  sounds.  Pulmonary:     Effort: Pulmonary effort is normal. No respiratory distress.     Breath sounds: No wheezing.  Abdominal:     General: Bowel sounds are normal. There is no distension.     Palpations: Abdomen is soft.  Musculoskeletal:        General: No deformity. Normal range of motion.     Cervical back: Normal range of motion and neck supple.  Skin:    General: Skin is warm and dry.     Findings: No erythema or rash.  Neurological:     Mental Status: He is alert and oriented to person, place, and time. Mental status is at baseline.     Cranial Nerves: No cranial nerve deficit.     Coordination: Coordination normal.  Psychiatric:        Mood and Affect: Mood normal.    LABORATORY DATA:  I have reviewed the data as listed Lab Results  Component Value Date   WBC 14.5 (H) 08/28/2021   HGB 11.0 (L) 08/28/2021   HCT 31.1 (L) 08/28/2021   MCV 96.0 08/28/2021   PLT 242 08/28/2021   Recent Labs    07/10/21 1510 08/25/21 0828 08/28/21 0431  NA  --  132* 132*  K  --  3.7 4.3  CL  --  98 100  CO2  --  27 25  GLUCOSE  --  126* 118*  BUN  --  9 9  CREATININE 1.00 1.03 1.02  CALCIUM  --  9.6 8.8*  GFRNONAA  --  >60 >60   Iron/TIBC/Ferritin/ %Sat No results found for: IRON, TIBC, FERRITIN, IRONPCTSAT    RADIOGRAPHIC STUDIES: I have personally reviewed the radiological images as listed and agreed with the findings in the report. CT CHEST W CONTRAST  Result Date: 08/14/2021 CLINICAL DATA:  Malignant neoplasm of sigmoid colon EXAM: CT CHEST WITH CONTRAST TECHNIQUE: Multidetector CT imaging of the chest was performed during intravenous contrast administration. CONTRAST:  122m OMNIPAQUE IOHEXOL 350 MG/ML SOLN COMPARISON:  07/10/2021 FINDINGS: Cardiovascular: The heart and great vessels are unremarkable without pericardial effusion. No evidence of thoracic aortic aneurysm or dissection. Moderate atherosclerosis of the coronary vasculature. Mediastinum/Nodes: No enlarged  mediastinal, hilar, or axillary lymph nodes. Thyroid gland, trachea, and esophagus demonstrate no significant findings. Lungs/Pleura: No acute airspace disease, effusion, or pneumothorax. Central airways are widely patent. Upper Abdomen: The hyperattenuating right lobe liver lesions seen previously are again identified and are  not appreciably changed. Right lobe subcapsular lesion image 148/2 measures 1.1 cm, and inferior right lobe lesion measuring 0.7 cm on image 181/2 are unchanged since prior exam. No new liver lesions are identified. Remainder of the upper abdomen is unremarkable. Musculoskeletal: No acute or destructive bony lesions. Reconstructed images demonstrate no additional findings. IMPRESSION: 1. No evidence of intrathoracic metastases. 2. No acute intrathoracic process. 3. Stable indeterminate liver lesions, metastatic disease not excluded. 4. Moderate atherosclerosis of the coronary arteries. Electronically Signed   By: Randa Ngo M.D.   On: 08/14/2021 19:00      ASSESSMENT & PLAN:  1. Malignant neoplasm of sigmoid colon ([place])   2. Goals of care, counseling/discussion   3. Prostate cancer [facility])   Cancer Staging Colon cancer [facility]) Staging form: Colon and Rectum, AJCC 8th Edition - Clinical stage from 08/27/2021: Stage IIA (cT3, cN0, cM0) - Signed by Earlie Server, MD on 09/04/2021  # Sigmoid cancer. Stage II with high risk features, + LVI and + PNI MMR is pending.  The diagnosis and care plan were discussed with patient in detail.  NCCN guidelines were reviewed and shared with patient.   If dMMR recommendation observation.  If pMMR, with the high risk features, recommend adjuvant chemotherapy 6 months capecitabine.  We discussed about rationale and side effects of chemotherapy. He agrees with plan if he needs to.  Awaiting MMR result.   # Prostate cancer,I recommend patient to follow up with urology. He is currently on active surveillance.  # recommend genetic testing, patient  prefers to defer at this point.   We spent sufficient time to discuss many aspect of care, questions were answered to patient's satisfaction.   All questions were answered. The patient knows to call the clinic with any problems questions or concerns.  cc Herbert Pun, *    Return of visit: TBD Thank you for this kind referral and the opportunity to participate in the care of this patient. A copy of today's note is routed to referring provider    Earlie Server, MD, PhD Hematology Oncology [place] [facility]  09/04/2021

## 2021-09-05 LAB — SURGICAL PATHOLOGY

## 2021-12-04 ENCOUNTER — Other Ambulatory Visit: Admission: RE | Admit: 2021-12-04 | Payer: Medicare Other | Source: Ambulatory Visit

## 2021-12-04 ENCOUNTER — Other Ambulatory Visit
Admission: RE | Admit: 2021-12-04 | Discharge: 2021-12-04 | Disposition: A | Discharge status: Home / Self Care | Payer: Medicare Other | Source: Ambulatory Visit | Attending: Urology | Admitting: Urology

## 2021-12-04 ENCOUNTER — Other Ambulatory Visit: Payer: Self-pay

## 2021-12-04 DIAGNOSIS — I4891 Unspecified atrial fibrillation: Secondary | ICD-10-CM

## 2021-12-04 NOTE — Patient Instructions (Addendum)
Your procedure is scheduled on: 12/11/21 - Thursday Report to the Registration Desk on the 1st floor of the [facility]. To find out your arrival time, please call [phone] between 1PM - 3PM on: 12/10/21 - Wednesday Report to [facility] for labs on 12/04/21 at 1:00 pm.  REMEMBER: Instructions that are not followed completely may result in serious medical risk, up to and including death; or upon the discretion of your surgeon and anesthesiologist your surgery may need to be rescheduled.  Do not eat food after midnight the night before surgery.  No gum chewing, lozengers or hard candies.  You may however, drink CLEAR liquids up to 2 hours before you are scheduled to arrive for your surgery. Do not drink anything within 2 hours of your scheduled arrival time.  Clear liquids include: - water  - apple juice without pulp - gatorade (not RED, PURPLE, OR BLUE) - black coffee or tea (Do NOT add milk or creamers to the coffee or tea) Do NOT drink anything that is not on this list.  TAKE THESE MEDICATIONS THE MORNING OF SURGERY WITH A SIP OF WATER:  - diltiazem (CARDIZEM CD) 120 MG 24 hr capsule - flecainide (TAMBOCOR) 100 MG tablet  Follow recommendations from Cardiologist, Pulmonologist or PCP regarding stopping Aspirin, Coumadin, Plavix, Eliquis, Pradaxa, or Pletal. Stop Eliquis for 7 days, beginning 12/04/21.  One week prior to surgery: Stop Anti-inflammatories (NSAIDS) such as Advil, Aleve, Ibuprofen, Motrin, Naproxen, Naprosyn and Aspirin based products such as Excedrin, Goodys Powder, BC Powder.  Stop ANY OVER THE COUNTER supplements until after surgery.  You may take Tylenol if needed for pain up until the day of surgery.  No Alcohol for 24 hours before or after surgery.  No Smoking including e-cigarettes for 24 hours prior to surgery.  No chewable tobacco products for at least 6 hours prior to surgery.  No nicotine patches on the day of surgery.  Do not use any  "recreational" drugs for at least a week prior to your surgery.  Please be advised that the combination of cocaine and anesthesia may have negative outcomes, up to and including death. If you test positive for cocaine, your surgery will be cancelled.  On the morning of surgery brush your teeth with toothpaste and water, you may rinse your mouth with mouthwash if you wish. Do not swallow any toothpaste or mouthwash.  Do not wear jewelry, make-up, hairpins, clips or nail polish.  Do not wear lotions, powders, or perfumes.   Do not shave body from the neck down 48 hours prior to surgery just in case you cut yourself which could leave a site for infection.  Also, freshly shaved skin may become irritated if using the CHG soap.  Contact lenses, hearing aids and dentures may not be worn into surgery.  Do not bring valuables to the hospital. [facility] is not responsible for any missing/lost belongings or valuables.   Fleets enema or bowel prep as directed.  Notify your doctor if there is any change in your medical condition (cold, fever, infection).  Wear comfortable clothing (specific to your surgery type) to the hospital.  After surgery, you can help prevent lung complications by doing breathing exercises.  Take deep breaths and cough every 1-2 hours. Your doctor may order a device called an Incentive Spirometer to help you take deep breaths. When coughing or sneezing, hold a pillow firmly against your incision with both hands. This is called splinting. Doing this helps protect your incision.  It also decreases belly discomfort.  If you are being admitted to the hospital overnight, leave your suitcase in the car. After surgery it may be brought to your room.  If you are being discharged the day of surgery, you will not be allowed to drive home. You will need a responsible adult (18 years or older) to drive you home and stay with you that night.   If you are taking public  transportation, you will need to have a responsible adult (18 years or older) with you. Please confirm with your physician that it is acceptable to use public transportation.   Please call the [facility] Dept. at [phone] if you have any questions about these instructions.  Surgery Visitation Policy:  Patients undergoing a surgery or procedure may have one family member or support person with them as long as that person is not COVID-19 positive or experiencing its symptoms.  That person may remain in the waiting area during the procedure and may rotate out with other people.  Inpatient Visitation:    Visiting hours are 7 a.m. to 8 p.m. Up to two visitors ages 16+ are allowed at one time in a patient room. The visitors may rotate out with other people during the day. Visitors must check out when they leave, or other visitors will not be allowed. One designated support person may remain overnight. The visitor must pass COVID-19 screenings, use hand sanitizer when entering and exiting the patients room and wear a mask at all times, including in the patients room. Patients must also wear a mask when staff or their visitor are in the room. Masking is required regardless of vaccination status.

## 2021-12-05 ENCOUNTER — Other Ambulatory Visit
Admission: RE | Admit: 2021-12-05 | Discharge: 2021-12-05 | Disposition: A | Discharge status: Home / Self Care | Payer: Medicare Other | Source: Ambulatory Visit | Attending: Urology | Admitting: Urology

## 2021-12-05 DIAGNOSIS — Z01812 Encounter for preprocedural laboratory examination: Secondary | ICD-10-CM | POA: Insufficient documentation

## 2021-12-05 DIAGNOSIS — I4891 Unspecified atrial fibrillation: Secondary | ICD-10-CM | POA: Insufficient documentation

## 2021-12-05 LAB — BASIC METABOLIC PANEL
Anion gap: 8 (ref 5–15)
BUN: 13 mg/dL (ref 8–23)
CO2: 29 mmol/L (ref 22–32)
Calcium: 9.8 mg/dL (ref 8.9–10.3)
Chloride: 94 mmol/L — ABNORMAL LOW (ref 98–111)
Creatinine, Ser: 1.07 mg/dL (ref 0.61–1.24)
GFR, Estimated: >60 mL/min (ref >60)
Glucose, Bld: 105 mg/dL — ABNORMAL HIGH (ref 70–99)
Potassium: 4.4 mmol/L (ref 3.5–5.1)
Sodium: 131 mmol/L — ABNORMAL LOW (ref 135–145)

## 2021-12-05 LAB — CBC
HCT: 37 % — ABNORMAL LOW (ref 39.0–52.0)
Hemoglobin: 12.6 g/dL — ABNORMAL LOW (ref 13.0–17.0)
MCH: 32.1 pg (ref 26.0–34.0)
MCHC: 34.1 g/dL (ref 30.0–36.0)
MCV: 94.4 fL (ref 80.0–100.0)
Platelets: 217 10*3/uL (ref 150–400)
RBC: 3.92 MIL/uL — ABNORMAL LOW (ref 4.22–5.81)
RDW: 13 % (ref 11.5–15.5)
WBC: 7.7 10*3/uL (ref 4.0–10.5)
nRBC: 0 % (ref 0.0–0.2)

## 2021-12-05 NOTE — Pre-Procedure Instructions (Signed)
Progress Notes - documented in this encounter Edwin Levans, MD - 10/27/2021 2:45 PM EST Formatting of this note is different from the original. Images from the original note were not included.   Chief Complaint: Chief Complaint  Patient presents with   4 month follow up  Date of Service: 10/27/2021 Date of Birth: Apr 10, 1951 PCP: Albina Billet, MD  History of Present Illness: Edwin [name] is a 71 y.o.male patient who presents for a follow-up visit. He has been seen in the past for treatment of his atrial fibrillation. He was placed on flecainide 100 mg twice daily and underwent cardioversion again. He converted to sinus rhythm with cardioversion however has had brief episodes of nonsustained recurrent atrial fibrillation/flutter. States he has occasional orthostatic dizziness. He is on 8 mg of doxazosin, 50 mg of losartan as well as metoprolol and diltiazem. We discussed possibly altering his doxazosin dose and seeing if this will help with his dizziness.Marland Kitchen He remains on apixaban at 5 mg twice daily, flecainide 100 mg daily and Cardizem 240 mg. Eliquis for anticoagulation and continued with flecainide. Recently has been noted to have rectal bleeding and CT showed possible colonic mass. This is being followed by oncology. He is currently stable with no rectal bleeding at present. Past Medical and Surgical History  Past Medical History Past Medical History:  Diagnosis Date   Atrial fibrillation, persistent (CMS-[name])   HTN (hypertension)   Prostate cancer (CMS-[name]) 06/06/2020  Biopsy completed by Ardelle Park, MD   Past Surgical History He has a past surgical history that includes biopsy prostate incisional (06/06/2020); cardioversion external (05/27/2018); cardioversion external (11/10/2017); Colonoscopy (08/06/2021); and sigmoid colectomy (08/27/2021).   Medications and Allergies  Current Medications  Current Outpatient Medications  Medication Sig Dispense Refill   calcium carbonate  500 mg calcium (1,250 mg) chewable tablet Take 500 mg of elemental by mouth 2 (two) times daily with meals   diltiazem (CARDIZEM CD) 120 MG XR capsule TAKE 1 CAPSULE BY MOUTH ONCE DAILY 90 capsule 3   doxazosin (CARDURA) 4 MG tablet Take 1 tablet (4 mg total) by mouth nightly Take one take daily 90 tablet 2   ELIQUIS 5 mg tablet TAKE 1 TABLET BY MOUTH TWICE A DAY 180 tablet 3   flecainide (TAMBOCOR) 100 MG tablet TAKE 1 TABLET BY MOUTH TWICE A DAY 180 tablet 3   losartan (COZAAR) 50 MG tablet TAKE 1 TABLET EVERY DAY 90 tablet 3   metoprolol succinate (TOPROL-XL) 25 MG XL tablet TAKE 1 TABLET BY MOUTH EVERY DAY 90 tablet 3   neomycin 500 mg tablet Take 2 tablets at 2 pm, 3 pm and 10 pm the day before surgery. (Patient not taking: Reported on 09/09/2021) 6 tablet 0   polyethylene glycol (MIRALAX) powder One bottle for colonoscopy prep. Use as directed. (Patient not taking: Reported on 09/09/2021) 255 g 0   No current facility-administered medications for this visit.   Allergies: Patient has no known allergies.  Social and Family History  Social History reports that he has never smoked. He has never used smokeless tobacco. He reports current alcohol use of about 42.0 standard drinks per week. He reports that he does not use drugs.  Family History No family history on file.  Review of Systems  Review of Systems  Constitutional: Negative for chills, diaphoresis, fever, malaise/fatigue and weight loss.  HENT: Negative for congestion, ear discharge, hearing loss and tinnitus.  Eyes: Negative for blurred vision.  Respiratory: Negative for cough, hemoptysis, sputum production and  wheezing.  Cardiovascular: Negative for chest pain, orthopnea, claudication, leg swelling and PND.  Gastrointestinal: Negative for abdominal pain, blood in stool, constipation, diarrhea, heartburn, melena, nausea and vomiting.  Genitourinary: Negative for dysuria, frequency, hematuria and urgency.  Musculoskeletal:  Negative for back pain, falls, joint pain and myalgias.  Skin: Negative for itching and rash.  Neurological: Negative for tingling, focal weakness, loss of consciousness, weakness and headaches.  Endo/Heme/Allergies: Negative for polydipsia. Does not bruise/bleed easily.  Psychiatric/Behavioral: Negative for depression, memory loss and substance abuse. The patient is not nervous/anxious.   Physical Examination   Vitals:BP 130/84   Pulse 54   Resp 16   Ht 185.4 cm (6\' 1" )   Wt 86.6 kg (191 lb)   SpO2 99%   BMI 25.20 kg/m  Ht:185.4 cm (6\' 1" ) Wt:86.6 kg (191 lb) WUJ:WJXB surface area is 2.11 meters squared. Body mass index is 25.2 kg/m.  Wt Readings from Last 3 Encounters:  10/27/21 86.6 kg (191 lb)  09/09/21 88 kg (194 lb)  08/19/21 88 kg (194 lb)   BP Readings from Last 3 Encounters:  10/27/21 130/84  09/09/21 (!) 155/90  08/19/21 131/76   General appearance appears in no acute distress  Head Mouth and Eye exam Normocephalic, without obvious abnormality, atraumatic Dentition is good Eyes appear anicteric   LUNGS Breath Sounds: Normal Percussion: Normal  CARDIOVASCULAR JVP CV wave: no HJR: no Elevation at 90 degrees: None Carotid Pulse: normal pulsation bilaterally Bruit: None Apex: apical impulse normal  Auscultation Rhythm: atrial fibrillation S1: normal S2: normal Clicks: no Rub: no Murmurs: no murmurs  Gallop: None  EXTREMITIES Clubbing: no Edema: trace to 1+ bilateral pedal edema Pulses: peripheral pulses symmetrical Femoral Bruits: no Amputation: no SKIN Rash: no Cyanosis: no Embolic phemonenon: no Bruising: no NEURO Alert and Oriented to person, place and time: yes Non focal: yes  PSYCH: Pt appears to have normal affect  Diagnostic Studies Reviewed:  EKG EKG demonstrated nonspecific ST and T waves changes, atrial fibrillation, rate 59.  Assessment and Plan   71 y.o. male with  ICD-10-CM ICD-9-CM  1. Atrial fibrillation,  unspecified type (CMS-[name])-peers to have persistent atypical flutter on this electrocardiogram. There is some baseline artifact limiting sensitivity. Will continue with this current regimen including Eliquis, flecainide, metoprolol and diltiazem. I48.91 427.31      2. Obstructive sleep apnea syndrome-consideration for sleep study was raised however patient symptoms have improved. Will defer sleep study for now.Marland Kitchen He continues to lose weight. Have encouraged him to continue with this. G47.33 327.23   3. Dizziness-is improved.  4. Colonic mass-follow-up with GI and oncology.  Return in about 6 months (around 04/26/2022).  These notes generated with voice recognition software. I apologize for typographical errors.  Edwin Levans, MD   Electronically signed by Edwin Levans, MD at 10/27/2021 4:29 PM EST  Plan of Treatment - documented as of this encounter Plan of Treatment - Upcoming Encounters Upcoming Encounters Date Type Specialty Care Team Description  04/28/2022 Office Visit Cardiology Fath, Aloha Gell, MD   [address]   [place], [place] [address]   [phone] (Work)   [phone] (Fax)       Visit Diagnoses - documented in this encounter Visit Diagnoses Diagnosis  Essential hypertension, benign - Primary    Persistent atrial fibrillation (CMS-[name])   Atrial fibrillation    Malignant neoplasm of colon, unspecified part of colon (CMS-[name])     Care Teams - documented as of this encounter Care Teams Team Member  Relationship Specialty Start Date End Date  Albina Billet, MD   [address] [facility] MAIN [facility] INTERNAL MEDICINE   [place], [place] [address]   [phone] (Work)   [phone] (Fax)   PCP - General Internal Medicine 09/09/17     Images Patient Demographics  Patient Demographics Patient Address Communication Language Race / Ethnicity Marital Status  Former (Oct. 01, 2018 - Jan. 25, 2021): [address] [place]) [place], [place] [address]  (Jan.  26, 2021 - ): [address] [place]) [place], [place] [address]  Former (Oct. 01, 2018 - Jan. 25, 2021): [address] [place]) [place], [place] [address] [phone] (Home) [phone] (Mobile) [email or web address] English (Preferred) White / Not Hispanic or Latino Married   Patient Contacts  Patient Contacts Contact Name Contact Address Communication Relationship to Patient  Zavian Slowey Unknown [phone] [facility]) Spouse, Emergency Contact   Document Information  Primary Care Provider Other Service Providers Document Coverage Dates  Albina Billet, MD (Oct. 11, 2018October 11, 2018 - Present) [phone] (Work) [phone] (Fax) [address] [place], [place] [address] Internal Medicine [facility] [place], [place] [address]  Nov. 28, 2022November 28, 2022   [place] [address] [place], [place] [address]   Encounter Providers Encounter Date  Edwin Levans, MD (Attending) [phone] (Work) [phone] (Fax) [address] [place],  [address] Cardiovascular Disease Nov. 28, 2022November 28, 2022   Legal Authenticator   Associate Chief Health Information Officer     Show All Sections

## 2021-12-07 NOTE — H&P (Signed)
NAMERICARD, FAULKNER MEDICAL RECORD NO: [id number] ACCOUNT NO: [id number] DATE OF BIRTH: November 11, 1951 FACILITY: [facility] LOCATION: [facility] PHYSICIAN: Otelia Limes. Yves Dill, MD  History and Physical   DATE OF ADMISSION: 12/11/2021  Same day surgery 12/11/2021.  CHIEF COMPLAINT:  Prostate cancer.  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  He has  been on active surveillance since that time and comes in now for followup biopsy.  The most recent PSA was 6.8 ng/mL.  A followup MRI scan 07/02/2021 indicate a 30 mL prostate with a PI-RAD category 4 lesion of the left side of the prostate measuring 11  mm in size.  The lesion had no significant change in size or other characteristics based upon the initial lesion at the time of his initial biopsy.  However, he did have a finding of a sigmoid colon cancer by this study as well as a CT scan underwent a  sigmoid colectomy in 07/2021.  He has now fully recovered for that and comes in now for UroNav fusion, biopsy of the prostate.  PAST MEDICAL HISTORY: ALLERGIES:  No drug allergies.  CURRENT MEDICATIONS:  Calcium carbonate, diltiazem XR, doxazosin, Eliquis, flecainide, losartan and metoprolol.  PAST SURGICAL HISTORY:  Cardioversion in 2018 and 2019.  PAST AND CURRENT MEDICAL CONDITIONS: 1.  Atrial fibrillation. 2.  Hypertension. 3.  Recent colon cancer.  REVIEW OF SYSTEMS:  The patient denied chest pain, shortness of breath, diabetes or stroke.  SOCIAL HISTORY:  The patient denies tobacco use.  Consumes 40 alcoholic beverages per week.  FAMILY HISTORY:  Father died at age 52 of cerebral hemorrhage and hypertension.  Mother died at age 90 with dementia.  Brother died at age 21 of heart disease.  PHYSICAL EXAMINATION:   VITAL SIGNS:  Height 5 feet  11 inches, weight 202 pounds, BMI 28. GENERAL:  The patient is a well-nourished white male in no acute distress. HEENT:  Sclerae were clear.  Pupils are equally round, reactive to light and accommodation.  Extraocular movements are intact. NECK:  No palpable masses or tenderness.  Thyroid gland was smooth without nodules. LYMPHATIC:  No palpable inguinal or cervical adenopathy. LUNGS:  Clear to auscultation. CARDIOVASCULAR:  Regular rhythm without audible murmurs. ABDOMEN:  Soft, nontender abdomen. BACK:  No CVA tenderness. GENITOURINARY:  Circumcised.  Testes smooth, nontender 20 mL in size each.  Rectal exam 40 gram smooth, nontender prostate. NEUROMUSCULAR: Grossly intact.  IMPRESSION:  Stage T1c, Gleason grade 3+3 adenocarcinoma of the prostate.  PLAN:  UroNav fusion, biopsy of the prostate.   PUS D: 12/05/2021 2:15:41 pm T: 12/05/2021 2:44:00 pm  JOB: [id number]/ [id number]

## 2021-12-11 ENCOUNTER — Ambulatory Visit: Payer: Medicare Other | Admitting: Certified Registered Nurse Anesthetist

## 2021-12-11 ENCOUNTER — Ambulatory Visit
Admission: RE | Admit: 2021-12-11 | Discharge: 2021-12-11 | Disposition: A | Discharge status: Home / Self Care | Payer: Medicare Other | Source: Ambulatory Visit | Attending: Urology | Admitting: Urology

## 2021-12-11 ENCOUNTER — Encounter: Payer: Self-pay | Admitting: Urology

## 2021-12-11 ENCOUNTER — Encounter
Admission: RE | Disposition: A | Discharge status: Home / Self Care | Payer: Self-pay | Source: Ambulatory Visit | Attending: Urology

## 2021-12-11 DIAGNOSIS — I10 Essential (primary) hypertension: Secondary | ICD-10-CM | POA: Insufficient documentation

## 2021-12-11 DIAGNOSIS — I700 Atherosclerosis of aorta: Secondary | ICD-10-CM | POA: Diagnosis not present

## 2021-12-11 DIAGNOSIS — Z79899 Other long term (current) drug therapy: Secondary | ICD-10-CM | POA: Diagnosis not present

## 2021-12-11 DIAGNOSIS — Z87891 Personal history of nicotine dependence: Secondary | ICD-10-CM | POA: Diagnosis not present

## 2021-12-11 DIAGNOSIS — I2510 Atherosclerotic heart disease of native coronary artery without angina pectoris: Secondary | ICD-10-CM | POA: Insufficient documentation

## 2021-12-11 DIAGNOSIS — C61 Malignant neoplasm of prostate: Secondary | ICD-10-CM | POA: Insufficient documentation

## 2021-12-11 DIAGNOSIS — Z85038 Personal history of other malignant neoplasm of large intestine: Secondary | ICD-10-CM | POA: Diagnosis not present

## 2021-12-11 DIAGNOSIS — K219 Gastro-esophageal reflux disease without esophagitis: Secondary | ICD-10-CM | POA: Insufficient documentation

## 2021-12-11 DIAGNOSIS — I4891 Unspecified atrial fibrillation: Secondary | ICD-10-CM | POA: Insufficient documentation

## 2021-12-11 HISTORY — PX: PROSTATE BIOPSY: SHX241

## 2021-12-11 SURGERY — BIOPSY, PROSTATE
Anesthesia: General

## 2021-12-11 MED ORDER — CHLORHEXIDINE GLUCONATE 0.12 % MT SOLN
OROMUCOSAL | Status: AC
  Administered 2021-12-11: 15 mL via OROMUCOSAL
  Filled 2021-12-11: qty 15

## 2021-12-11 MED ORDER — SUGAMMADEX SODIUM 200 MG/2ML IV SOLN
INTRAVENOUS | Status: DC | PRN
  Administered 2021-12-11: 400 mg via INTRAVENOUS

## 2021-12-11 MED ORDER — CEFAZOLIN SODIUM-DEXTROSE 1-4 GM/50ML-% IV SOLN
1.0000 g | Freq: Once | INTRAVENOUS | Status: AC
  Administered 2021-12-11: 1 g via INTRAVENOUS

## 2021-12-11 MED ORDER — ROCURONIUM BROMIDE 100 MG/10ML IV SOLN
INTRAVENOUS | Status: DC | PRN
  Administered 2021-12-11: 50 mg via INTRAVENOUS

## 2021-12-11 MED ORDER — LIDOCAINE HCL (CARDIAC) PF 100 MG/5ML IV SOSY
PREFILLED_SYRINGE | INTRAVENOUS | Status: DC | PRN
  Administered 2021-12-11: 100 mg via INTRAVENOUS

## 2021-12-11 MED ORDER — SUGAMMADEX SODIUM 500 MG/5ML IV SOLN
INTRAVENOUS | Status: AC
  Filled 2021-12-11: qty 5

## 2021-12-11 MED ORDER — GENTAMICIN SULFATE 40 MG/ML IJ SOLN
80.0000 mg | Freq: Once | INTRAMUSCULAR | Status: AC
Start: 2021-12-11 — End: 2021-12-11
  Administered 2021-12-11: 80 mg via INTRAVENOUS
  Filled 2021-12-11: qty 2

## 2021-12-11 MED ORDER — LEVOFLOXACIN 500 MG PO TABS: 500.0000 mg | ORAL_TABLET | Freq: Every day | ORAL | 1 refills | Status: DC

## 2021-12-11 MED ORDER — FAMOTIDINE 20 MG PO TABS
ORAL_TABLET | ORAL | Status: AC
  Administered 2021-12-11: 20 mg via ORAL
  Filled 2021-12-11: qty 1

## 2021-12-11 MED ORDER — FENTANYL CITRATE (PF) 100 MCG/2ML IJ SOLN
INTRAMUSCULAR | Status: DC | PRN
  Administered 2021-12-11 (×2): 25 ug via INTRAVENOUS

## 2021-12-11 MED ORDER — LIDOCAINE HCL (PF) 2 % IJ SOLN
INTRAMUSCULAR | Status: AC
  Filled 2021-12-11: qty 5

## 2021-12-11 MED ORDER — DEXAMETHASONE SODIUM PHOSPHATE 10 MG/ML IJ SOLN
INTRAMUSCULAR | Status: AC
  Filled 2021-12-11: qty 1

## 2021-12-11 MED ORDER — PHENYLEPHRINE HCL (PRESSORS) 10 MG/ML IV SOLN
INTRAVENOUS | Status: DC | PRN
  Administered 2021-12-11: 80 ug via INTRAVENOUS

## 2021-12-11 MED ORDER — ONDANSETRON HCL 4 MG/2ML IJ SOLN
INTRAMUSCULAR | Status: DC | PRN
  Administered 2021-12-11: 4 mg via INTRAVENOUS

## 2021-12-11 MED ORDER — PROPOFOL 10 MG/ML IV BOLUS
INTRAVENOUS | Status: DC | PRN
  Administered 2021-12-11: 140 mg via INTRAVENOUS

## 2021-12-11 MED ORDER — FENTANYL CITRATE (PF) 100 MCG/2ML IJ SOLN
INTRAMUSCULAR | Status: AC
  Filled 2021-12-11: qty 2

## 2021-12-11 MED ORDER — CEFAZOLIN SODIUM-DEXTROSE 1-4 GM/50ML-% IV SOLN
INTRAVENOUS | Status: AC
  Filled 2021-12-11: qty 50

## 2021-12-11 MED ORDER — ONDANSETRON HCL 4 MG/2ML IJ SOLN
INTRAMUSCULAR | Status: AC
  Filled 2021-12-11: qty 2

## 2021-12-11 MED ORDER — FAMOTIDINE 20 MG PO TABS: 20.0000 mg | ORAL_TABLET | Freq: Once | ORAL | Status: AC

## 2021-12-11 MED ORDER — ORAL CARE MOUTH RINSE
15.0000 mL | Freq: Once | OROMUCOSAL | Status: AC
Start: 2021-12-11 — End: 2021-12-11

## 2021-12-11 MED ORDER — DEXAMETHASONE SODIUM PHOSPHATE 10 MG/ML IJ SOLN
INTRAMUSCULAR | Status: DC | PRN
  Administered 2021-12-11: 10 mg via INTRAVENOUS

## 2021-12-11 MED ORDER — EPHEDRINE SULFATE 50 MG/ML IJ SOLN
INTRAMUSCULAR | Status: DC | PRN
  Administered 2021-12-11 (×2): 10 mg via INTRAVENOUS
  Administered 2021-12-11: 5 mg via INTRAVENOUS

## 2021-12-11 MED ORDER — SUCCINYLCHOLINE CHLORIDE 200 MG/10ML IV SOSY
PREFILLED_SYRINGE | INTRAVENOUS | Status: AC
  Filled 2021-12-11: qty 10

## 2021-12-11 MED ORDER — LACTATED RINGERS IV SOLN: INTRAVENOUS | Status: DC

## 2021-12-11 MED ORDER — PROPOFOL 10 MG/ML IV BOLUS
INTRAVENOUS | Status: AC
  Filled 2021-12-11: qty 20

## 2021-12-11 MED ORDER — SEVOFLURANE IN SOLN
RESPIRATORY_TRACT | Status: AC
  Filled 2021-12-11: qty 250

## 2021-12-11 MED ORDER — CHLORHEXIDINE GLUCONATE 0.12 % MT SOLN: 15.0000 mL | Freq: Once | OROMUCOSAL | Status: AC

## 2021-12-11 MED ORDER — ONDANSETRON HCL 4 MG/2ML IJ SOLN: 4.0000 mg | Freq: Once | INTRAMUSCULAR | Status: DC | PRN

## 2021-12-11 MED ORDER — FENTANYL CITRATE (PF) 100 MCG/2ML IJ SOLN: 25.0000 ug | INTRAMUSCULAR | Status: DC | PRN

## 2021-12-11 SURGICAL SUPPLY — 19 items
COVER MAYO STAND REUSABLE (DRAPES) ×2 IMPLANT
COVER TRANSDUCER ULTRASOUND (MISCELLANEOUS) IMPLANT
COVER TRANSDUCER UNLTRASOUND (MISCELLANEOUS) ×1 IMPLANT
FEE DELIVERY LASER CO2 FORTEC (MISCELLANEOUS) IMPLANT
GLOVE SURG ENC MOIS LTX SZ7 (GLOVE) ×4 IMPLANT
GUIDE NDL URONAV ULTRASND S (MISCELLANEOUS) IMPLANT
GUIDE NEEDLE URONAV ULTRASND S (MISCELLANEOUS) ×1 IMPLANT
INST BIOPSY MAXCORE 18GX25 (NEEDLE) ×2 IMPLANT
LASER CO2 FORTEC DELIVERY FEE (MISCELLANEOUS) ×2 IMPLANT
MANIFOLD NEPTUNE II (INSTRUMENTS) ×2 IMPLANT
PROBE URONAV BK 8808E 8818 HLD (MISCELLANEOUS) IMPLANT
STRAP SAFETY 5IN WIDE (MISCELLANEOUS) ×2 IMPLANT
SURGILUBE 2OZ TUBE FLIPTOP (MISCELLANEOUS) ×2 IMPLANT
TOWEL OR 17X26 4PK STRL BLUE (TOWEL DISPOSABLE) ×2 IMPLANT
URONAV BK 8808E 8818 PROBE HLD (MISCELLANEOUS) ×2
URONAV MRI FUSION TWO PATIENTS (MISCELLANEOUS) ×1 IMPLANT
URONAV ULTRASOUND (MISCELLANEOUS) ×1 IMPLANT
URONAV ULTRASOUND NDL GUIDE S (MISCELLANEOUS) ×2
WATER STERILE IRR 500ML POUR (IV SOLUTION) ×2 IMPLANT

## 2021-12-11 NOTE — Op Note (Signed)
Preoperative diagnosis:  1.  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  Two PIRAD category 4 lesions of the prostate (D40.0)  Postoperative diagnosis: 1.  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  Two PIRAD category 4 lesions of the prostate  Procedure: Uronav fusion biopsy of the prostate (CPT [phone], [id number])     Surgeon: Otelia Limes. Yves Dill MD  Anesthesia: General  Indications:See the history and physical. 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  He has  been on active surveillance since that time and comes in now for followup biopsy.  The most recent PSA was 6.8 ng/mL.  A followup MRI scan 07/02/2021 indicate a 30 mL prostate with a PI-RAD category 4 lesion of the left side of the prostate measuring 11  mm in size.  The lesion had no significant change in size or other characteristics based upon the initial lesion at the time of his initial biopsy. After informed consent the above procedure(s) were requested     Technique and findings: After adequate general anesthesia obtained patient was placed into left lateral decubitus position and DRE was performed.  The rectum was noted to be clean.  The ultrasound probe was then placed and ultrasound images acquired.  The ultrasound images were then fused with the MRI images.  Region of interest #1 was identified and 4 core biopsies taken here.  Region of interest #2 was identified and 3 core biopsies taken here.  At this point standard 12 core systematic core biopsies were performed.  The ultrasound probe was then removed.  Blood loss was minimal.  The procedure was then terminated and patient transferred to the recovery room in stable  condition.

## 2021-12-11 NOTE — Transfer of Care (Signed)
Immediate Anesthesia Transfer of Care Note  Patient: Edwin [name]  Procedure(s) Performed: PROSTATE BIOPSY URONAV  Patient Location: PACU  Anesthesia Type:General  Level of Consciousness: awake, alert  and drowsy  Airway & Oxygen Therapy: Patient Spontanous Breathing and Patient connected to nasal cannula oxygen  Post-op Assessment: Report given to RN and Post -op Vital signs reviewed and stable  Post vital signs: Reviewed and stable  Last Vitals:  Vitals Value Taken Time  BP 123/72 12/11/21 0834  Temp    Pulse 65 12/11/21 0837  Resp    SpO2 100 % 12/11/21 0837  Vitals shown include unvalidated device data.  Last Pain:  Vitals:   12/11/21 0641  TempSrc: Temporal  PainSc: 0-No pain         Complications: No notable events documented.

## 2021-12-11 NOTE — H&P (Signed)
Date of Initial H&P: 12/05/20  History reviewed, patient examined, no change in status, stable for surgery.

## 2021-12-11 NOTE — Discharge Instructions (Addendum)
Transrectal Ultrasound-Guided Prostate Biopsy, Care After What can I expect after the procedure? After the procedure, it is common to have: Pain and discomfort near your butt (rectum), especially while sitting. Pink-colored pee (urine). This is due to small amounts of blood in your pee. A burning feeling while peeing. Blood in your poop (stool). Bleeding from your butt. Blood in your semen. Follow these instructions at home: Medicines Take over-the-counter and prescription medicines only as told by your doctor. If you were given a sedative during your procedure, do not drive or use machines until your doctor says that it is safe. A sedative is a medicine that helps you relax. If you were prescribed an antibiotic medicine, take it as told by your doctor. Do not stop taking it even if you start to feel better. Activity A sign showing that a person should not lift anything heavy.  Return to your normal activities when your doctor says that it is safe. Ask your doctor when it is okay for you to have sex. You may have to avoid lifting. Ask your doctor how much you can safely lift. General instructions A comparison of three sample cups showing dark yellow, yellow, and pale yellow urine.  Drink enough water to keep your pee pale yellow. Watch your pee, poop, and semen for new bleeding or bleeding that gets worse. Keep all follow-up visits. Contact a doctor if: You have any of these: Blood clots in your pee or poop. Blood in your pee more than 2 weeks after the procedure. Blood in your semen more than 2 months after the procedure. New or worse bleeding in your pee, poop, or semen. Very bad belly pain. Your pee smells bad or unusual. You have trouble peeing. Your lower belly feels firm. You have problems getting an erection. You feel like you may vomit (are nauseous), or you vomit. Get help right away if: You have a fever or chills. You have bright red pee. You have very bad pain that  does not get better with medicine. You cannot pee. Summary After this procedure, it is common to have pain and discomfort near your butt, especially while sitting. You may have blood in your pee and poop. It is common to have blood in your semen. Get help right away if you have a fever or chills. This information is not intended to replace advice given to you by your health care provider. Make sure you discuss any questions you have with your health care provider. Document Revised: 05/12/2021 Document Reviewed: 05/12/2021 Elsevier Patient Education  2022 [place]   The drugs that you were given will stay in your system until tomorrow so for the next 24 hours you should not:  Drive an automobile Make any legal decisions Drink any alcoholic beverage   You may resume regular meals tomorrow.  Today it is better to start with liquids and gradually work up to solid foods.  You may eat anything you prefer, but it is better to start with liquids, then soup and crackers, and gradually work up to solid foods.   Please notify your doctor immediately if you have any unusual bleeding, trouble breathing, redness and pain at the surgery site, drainage, fever, or pain not relieved by medication.    Additional Instructions:    Please contact your physician with any problems or Same Day Surgery at [phone], Monday through Friday 6 am to 4 pm, or [place] [facility] number at  [phone].   ° °

## 2021-12-11 NOTE — Anesthesia Procedure Notes (Signed)
Procedure Name: Intubation Date/Time: 12/11/2021 7:52 AM Performed by: Johnna Acosta, CRNA Pre-anesthesia Checklist: Patient identified, Emergency Drugs available, Suction available, Patient being monitored and Timeout performed Patient Re-evaluated:Patient Re-evaluated prior to induction Oxygen Delivery Method: Circle system utilized Preoxygenation: Pre-oxygenation with 100% oxygen Induction Type: IV induction Ventilation: Mask ventilation without difficulty Laryngoscope Size: McGraph and 3 Grade View: Grade I Tube type: Oral Tube size: 7.5 mm Number of attempts: 1 Airway Equipment and Method: Stylet and Video-laryngoscopy Placement Confirmation: positive ETCO2, ETT inserted through vocal cords under direct vision and breath sounds checked- equal and bilateral Secured at: 23 cm Tube secured with: Tape Dental Injury: Teeth and Oropharynx as per pre-operative assessment

## 2021-12-11 NOTE — Anesthesia Preprocedure Evaluation (Signed)
Anesthesia Evaluation  Patient identified by MRN, date of birth, ID band Patient awake    Reviewed: Allergy & Precautions, NPO status , Patient's Chart, lab work & pertinent test results  History of Anesthesia Complications Negative for: history of anesthetic complications  Airway Mallampati: II  TM Distance: >3 FB Neck ROM: Full    Dental  (+) Poor Dentition, Caps, Dental Advidsory Given   Pulmonary neg pulmonary ROS, neg shortness of breath, neg sleep apnea, neg COPD, neg recent URI, Patient abstained from smoking.Not current smoker,    Pulmonary exam normal breath sounds clear to auscultation       Cardiovascular Exercise Tolerance: Good METShypertension, (-) angina+ CAD  (-) Past MI + dysrhythmias Atrial Fibrillation + Valvular Problems/Murmurs  Rhythm:Regular Rate:Normal - Systolic murmurs    Neuro/Psych negative neurological ROS  negative psych ROS   GI/Hepatic GERD  Controlled,(+)     (-) substance abuse  ,   Endo/Other  neg diabetes  Renal/GU negative Renal ROS     Musculoskeletal   Abdominal   Peds  Hematology   Anesthesia Other Findings Past Medical History: No date: Acid reflux 08/06/2021: Adenocarcinoma of colon ([place])     Comment:  a.) CT on 07/10/2021 --> 20-25 cm from anal verge;               measured 3.7 x 2.7 cm.  b.) pathology (+) for invasive               moderately differentiated adenocarcinoma. No date: Aortic atherosclerosis ([place]) No date: Atherosclerosis of coronary artery     Comment:  a.) CT chest on 08/14/2021 --> moderate. No date: Atrial fibrillation and flutter ([name])     Comment:  a.) CHA2DS2VASc = 3 (age, HTN, aortic plaque); on               apixaban. No date: Chronic anticoagulation     Comment:  a.) Apixaban No date: Hypertension No date: Lesion of right lobe of liver     Comment:  a.) CT on 07/10/2021 --> 1.0 cm and 0.8 cm lesions in               anterior RIGHT  lobe. No date: LVH (left ventricular hypertrophy)     Comment:  a.) TTE on 09/16/2017 - mild No date: Prostate cancer ([place]) No date: Valvular regurgitation     Comment:  a.) TTE on 09/23/2017 --> LVEF 55%; mild MV, TV, PV  Reproductive/Obstetrics                             Anesthesia Physical  Anesthesia Plan  ASA: 3  Anesthesia Plan: General   Post-op Pain Management:    Induction: Intravenous  PONV Risk Score and Plan: 4 or greater and Ondansetron, Dexamethasone, Treatment may vary due to age or medical condition and Midazolam  Airway Management Planned: Oral ETT and LMA  Additional Equipment: None  Intra-op Plan:   Post-operative Plan: Extubation in OR  Informed Consent: I have reviewed the patients History and Physical, chart, labs and discussed the procedure including the risks, benefits and alternatives for the proposed anesthesia with the patient or authorized representative who has indicated his/her understanding and acceptance.     Dental advisory given  Plan Discussed with: CRNA and Surgeon  Anesthesia Plan Comments: (Discussed risks of anesthesia with patient, including PONV, sore throat, lip/dental damage. Rare risks discussed as well, such as cardiorespiratory and neurological sequelae,  and allergic reactions. Patient understands.)        Anesthesia Quick Evaluation

## 2021-12-13 NOTE — Anesthesia Postprocedure Evaluation (Signed)
Anesthesia Post Note  Patient: Edwin [name]  Procedure(s) Performed: PROSTATE BIOPSY URONAV  Patient location during evaluation: PACU Anesthesia Type: General Level of consciousness: awake and alert Pain management: pain level controlled Vital Signs Assessment: post-procedure vital signs reviewed and stable Respiratory status: spontaneous breathing, nonlabored ventilation, respiratory function stable and patient connected to nasal cannula oxygen Cardiovascular status: blood pressure returned to baseline and stable Postop Assessment: no apparent nausea or vomiting Anesthetic complications: no   No notable events documented.   Last Vitals:  Vitals:   12/11/21 0910 12/11/21 0916  BP: 134/81 133/71  Pulse: (!) 57 61  Resp: 11 16  Temp:  (!) 36.1 C  SpO2: 100%     Last Pain:  Vitals:   12/12/21 0901  TempSrc:   PainSc: 0-No pain                 Martha Clan

## 2021-12-15 LAB — SURGICAL PATHOLOGY

## 2022-01-05 ENCOUNTER — Inpatient Hospital Stay: Admission: RE | Admit: 2022-01-05 | Payer: Medicare Other | Source: Ambulatory Visit

## 2022-01-09 ENCOUNTER — Other Ambulatory Visit
Admission: RE | Admit: 2022-01-09 | Discharge: 2022-01-09 | Disposition: A | Discharge status: Home / Self Care | Payer: Medicare Other | Source: Ambulatory Visit | Attending: Urology | Admitting: Urology

## 2022-01-09 ENCOUNTER — Other Ambulatory Visit: Payer: Self-pay

## 2022-01-09 ENCOUNTER — Encounter: Payer: Self-pay | Admitting: Urology

## 2022-01-09 NOTE — Progress Notes (Addendum)
Perioperative Services  Pre-Admission/Anesthesia Testing Clinical Review  Date: 01/14/22  Patient Demographics:  Name: Edwin [name] DOB:   May 28, 1951 MRN:   [id number]  Planned Surgical Procedure(s):    Case: [id number] Date/Time: 01/15/22 0945   Procedure: HIGH INTENSITY FOCUSED ULTRASOUND (HIFU) OF THE PROSTATE (Left)   Anesthesia type: Choice   Pre-op diagnosis: PROSTATE CANCER   Location: [facility] OR ROOM 10 / [facility]   Surgeons: Royston Cowper, MD   NOTE: Available PAT nursing documentation and vital signs have been reviewed. Clinical nursing staff has updated patient's PMH/PSHx, current medication list, and drug allergies/intolerances to ensure comprehensive history available to assist in medical decision making as it pertains to the aforementioned surgical procedure and anticipated anesthetic course. Extensive review of available clinical information performed. [place] [facility] and PSHx updated with any diagnoses/procedures that  may have been inadvertently omitted during his intake with the pre-admission testing department's nursing staff.  Clinical Discussion:  Edwin [name] is a 71 y.o. male who is submitted for pre-surgical anesthesia review and clearance prior to him undergoing the above procedure. Patient has never been a smoker.  Pertinent PMH includes: atrial fibrillation/flutter, valvular regurgitation, LVH, coronary artery atherosclerosis, aortic atherosclerosis, HTN, acid reflux (uses PRN CaCO3), prostate cancer, colon cancer.  Patient is followed by cardiology Edwin Glassing, MD). He was last seen in the cardiology clinic on 10/27/2021; notes reviewed.  At the time of his clinic visit, patient doing well overall from a cardiovascular perspective.  He denied any episodes of chest pain, shortness of breath, PND, orthopnea, significant peripheral edema, or presyncope/syncope.  Patient with intermittent episodes of dizziness associated with position changes.  PMH  significant for cardiovascular diagnoses.  Long-term cardiac event monitor study performed on 09/20/2017 revealed a predominant underlying atrial fibrillation; minimum heart rate 59 bpm, maximum heart rate 166 bpm, and average heart rate 96 bpm.  There were rare PVCs with no ventricular runs.  There were no prolonged pauses greater than 2 seconds.  TTE performed on 09/23/2017 revealed a normal left ventricular systolic function with mild LVH; LVEF 55%.  There was mild mitral, tricuspid, and pulmonary valve regurgitation.  No evidence of valvular stenosis.  Patient underwent DCCV procedure on 11/10/2017.  200 J shocks x 3 were delivered with a postprocedural atrial flutter noted on the monitor.  Placement placed on flecainide.  Patient underwent second DCCV procedure on 05/27/2018 for recurrent non-sustained atrial fibrillation/flutter.  This procedure required a single 120 J shock for converting patient to normal sinus rhythm.    PMH significant for paroxysmal atrial fibrillation/flutter; CHA2DS2-VASc Score = 3 (age, HTN, aortic plaque).  Rate and rhythm maintained on oral diltiazem, metoprolol, and flecainide.  Patient is chronically anticoagulated using daily apixaban; compliant with therapy with no evidence of GI bleeding.  Blood pressure reasonably controlled at 130/84 on prescribed CCB, alpha blocker, ARB, and beta-blocker therapies.  Patient not currently taking any type of lipid-lowering therapies for ASCVD prevention.  He is not diabetic. Functional capacity, as defined by DASI, is documented as being >/= 4 METS.  No changes were made to patient's medication regimen.  Patient to follow-up with outpatient cardiology in 6 months or sooner if needed.  Edwin [name] is scheduled for a HIGH INTENSITY FOCUSED ULTRASOUND (HIFU) OF THE PROSTATE on 01/15/2022 with Dr. Maryan Puls, MD.  Given patient's past medical history significant for cardiovascular diagnoses, presurgical cardiac clearance was sought  by the performing surgeon's office and PAT team. Per cardiology, "this patient  is optimized for surgery and may proceed with the planned procedural course with AN ACCEPTABLE risk of significant perioperative cardiovascular complications". Again, this patient is on daily anticoagulation therapy. He has been instructed on recommendations for holding his apixaban for 3 days prior to his procedure with plans to restart as soon as postoperative bleeding risk felt to be minimized by his attending surgeon. The patient has been instructed that his last dose of his anticoagulant will be on 01/11/2022.  Patient denies previous perioperative complications with anesthesia in the past. In review of the available records, it is noted that patient underwent a general anesthetic course here (ASA III) in 11/2021 without documented complications.   Vitals with BMI 01/09/2022 12/11/2021 12/11/2021  Height 6\' 1"  - -  Weight 188 lbs - -  BMI 16.10 - -  Systolic - 960 454  Diastolic - 71 81  Pulse - 61 57    Providers/Specialists:   NOTE: Primary physician provider listed below. Patient may have been seen by APP or partner within same practice.   PROVIDER ROLE / SPECIALTY LAST Towana Badger, MD Urology (Surgeon) 12/25/2021  Albina Billet, MD Primary Care Provider ???  Bartholome Bill, MD Cardiology 10/27/2021  Earlie Server, MD Oncology 09/04/2021   Allergies:  Patient has no known allergies.  Current Home Medications:   No current facility-administered medications for this encounter.    calcium carbonate (TUMS - DOSED IN MG ELEMENTAL CALCIUM) 500 MG chewable tablet   diltiazem (CARDIZEM CD) 120 MG 24 hr capsule   doxazosin (CARDURA) 4 MG tablet   ELIQUIS 5 MG TABS tablet   flecainide (TAMBOCOR) 100 MG tablet   losartan (COZAAR) 50 MG tablet   metoprolol succinate (TOPROL-XL) 25 MG 24 hr tablet   History:   Past Medical History:  Diagnosis Date   Acid reflux    Adenocarcinoma of colon ([place])  08/06/2021   a.) CT on 07/10/2021 --> 20-25 cm from anal verge; measured 3.7 x 2.7 cm.  b.) pathology (+) for invasive moderately differentiated adenocarcinoma.   Aortic atherosclerosis ([name])    Atherosclerosis of coronary artery    a.) CT chest on 08/14/2021 --> moderate.   Atrial fibrillation and flutter ([place])    a.) CHA2DS2VASc = 3 (age, HTN, aortic plaque). b.) rate/rhythm maintained on oral diltiazem + flecanide; chronically anticoagulated using apixaban.   Hypertension    Lesion of right lobe of liver    a.) CT on 07/10/2021 --> 1.0 cm and 0.8 cm lesions in anterior RIGHT lobe.   Long term current use of anticoagulant    a.) apixaban   LVH (left ventricular hypertrophy)    a.) TTE on 09/16/2017 - mild   Prostate cancer ([place])    a.) Bx (+) for acinar adenocarcinoma   Valvular regurgitation    a.) TTE on 09/23/2017 --> LVEF 55%; mild MV, TV, PV   Past Surgical History:  Procedure Laterality Date   CARDIOVERSION N/A 11/10/2017   Procedure: CARDIOVERSION;  Surgeon: Teodoro Spray, MD;  Location: [facility];  Service: Cardiovascular;  Laterality: N/A;   CARDIOVERSION N/A 05/27/2018   Procedure: CARDIOVERSION;  Surgeon: Teodoro Spray, MD;  Location: [name];  Service: Cardiovascular;  Laterality: N/A;   COLONOSCOPY WITH PROPOFOL N/A 08/06/2021   Procedure: COLONOSCOPY WITH PROPOFOL;  Surgeon: Robert Bellow, MD;  Location: [facility];  Service: Endoscopy;  Laterality: N/A;   PROSTATE BIOPSY N/A 06/06/2020   Procedure: PROSTATE BIOPSY [name];  Surgeon: Royston Cowper, MD;  Location: [name];  Service: Urology;  Laterality: N/A;   PROSTATE BIOPSY N/A 12/11/2021   Procedure: PROSTATE BIOPSY [name];  Surgeon: Royston Cowper, MD;  Location: [name];  Service: Urology;  Laterality: N/A;   Family History  Problem Relation Age of Onset   Dementia Mother    Sudden death Father    Heart disease Brother    Social History   Tobacco Use   Smoking status: Never   Smokeless  tobacco: Never  Vaping Use   Vaping Use: Never used  Substance Use Topics   Alcohol use: Yes    Comment: approx 4 beers/ day   Drug use: No    Pertinent Clinical Results:  LABS: Labs reviewed: Acceptable for surgery.  Lab Results  Component Value Date   WBC 7.7 12/05/2021   HGB 12.6 (L) 12/05/2021   HCT 37.0 (L) 12/05/2021   MCV 94.4 12/05/2021   PLT 217 12/05/2021   Lab Results  Component Value Date   NA 131 (L) 12/05/2021   K 4.4 12/05/2021   CO2 29 12/05/2021   GLUCOSE 105 (H) 12/05/2021   BUN 13 12/05/2021   CREATININE 1.07 12/05/2021   CALCIUM 9.8 12/05/2021   GFRNONAA >60 12/05/2021    ECG: Date: 08/25/2021 Time ECG obtained: 0825 AM Rate: 53 bpm Rhythm: sinus bradycardia Axis (leads I and aVF): Normal Intervals: PR 168 ms. QRS 102 ms. QTc 409 ms. ST segment and T wave changes: No evidence of acute ST segment elevation or depression Comparison: Similar to previous tracing obtained on 03/17/2020   IMAGING / PROCEDURES: CT CHEST WITH CONTRAST performed on 08/14/2021 No evidence of intrathoracic metastases No acute intrathoracic process Stable indeterminate liver lesions, metastatic disease not excluded Moderate atherosclerosis of the coronary arteries  CT ABDOMEN AND PELVIS WITH CONTRAST performed on 07/10/2021 The colon is very redundant, particularly the sigmoid colon, and there is a fungating endoluminal mass at the left aspect of the sigmoid colon, approximately 20-25 cm from the anal verge, measuring at least 3.7 x 2.7 cm. Findings are consistent with primary colon malignancy. No definite evidence of lymphadenopathy or metastatic disease in the abdomen or pelvis. There are two small enhancing lesions of the anterior right lobe of the liver measuring 1.0 cm and 0.8 cm. These most likely reflect small flash filling hemangiomata, hepatic metastases not favored although not strictly excluded. Due to small size, these may be technically difficult to  characterize by multiphasic MR. Attention on follow-up. Aortic atherosclerosis  MRI PROSTATE WITH/WITHOUT CONTRAST performed on 07/02/2021 Colonic neoplasm likely distal sigmoid, difficult to localize due to limited imaging of the pelvis, potentially in sigmoid colon. Suggest CT of the abdomen and pelvis as this is more likely visible on CT for better localization, given that only limited imaging of the pelvis is present currently. Colonoscopy is also recommended. PIRADS category 4 lesion at the LEFT prostate base. More defined on T2 than on previous imaging and more pronounced on diffusion. PIRADS category 4 lesion at the LEFT prostate apex is similar to prior imaging. Changes of BPH. No evidence of extracapsular extension, seminal vesicle invasion, or metastatic disease in the pelvis  TRANSTHORACIC ECHOCARDIOGRAM performed on 09/23/2017 LVEF 55% Normal left ventricular systolic function with mild LVH Normal right ventricular systolic function Mild MR, TR, and PR No AR No evidence of valvular stenosis No pericardial effusion  LONG TERM CARDIAC EVENT MONITOR STUDY performed on 0/22/2018 Predominant underlying atrial fibrillation with a minimum HR of 59 bpm, maximum HR 166 bpm,  and average HR of 96 bpm. Rare ventricular premature beats No prolonged pauses greater than 2 seconds No ventricular runs  Impression and Plan:  Edwin [name] has been referred for pre-anesthesia review and clearance prior to him undergoing the planned anesthetic and procedural courses. Available labs, pertinent testing, and imaging results were personally reviewed by me. This patient has been appropriately cleared by cardiology with an overall LOW risk of significant perioperative cardiovascular complications.  Based on clinical review performed today (01/14/22), barring any significant acute changes in the patient's overall condition, it is anticipated that he will be able to proceed with the planned surgical  intervention. Any acute changes in clinical condition may necessitate his procedure being postponed and/or cancelled. Patient will meet with anesthesia team (MD and/or CRNA) on the day of his procedure for preoperative evaluation/assessment. Questions regarding anesthetic course will be fielded at that time.   Pre-surgical instructions were reviewed with the patient during his PAT appointment and questions were fielded by PAT clinical staff. Patient was advised that if any questions or concerns arise prior to his procedure then he should return a call to PAT and/or his surgeon's office to discuss.  Honor Loh, MSN, APRN, FNP-C, CEN [facility]  Peri-operative Services Nurse Practitioner Phone: [phone] Fax: [phone] 01/14/22 1:29 PM  NOTE: This note has been prepared using Dragon dictation software. Despite my best ability to proofread, there is always the potential that unintentional transcriptional errors may still occur from this process.

## 2022-01-09 NOTE — H&P (Signed)
[name], [name] MEDICAL RECORD NO: [id number] ACCOUNT NO: [id number] DATE OF BIRTH: 06-21-1951 FACILITY: [facility] LOCATION: [facility] PHYSICIAN: Otelia Limes. Yves Dill, MD  History and Physical   DATE OF ADMISSION: 01/15/2022  CHIEF COMPLAINT:  Prostate cancer.  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  He initially chose active surveillance, but his PSA  increased up to 6.8 ng on 06/23/2021 and an MRI scan on 07/02/2021 showed 2 PI-RADS category 4 lesions on the left side and a 30 mL prostate.  Biopsy on 12/11/2021 revealed a Gleason's grade 3+3 adenocarcinoma involving region of interest #1 and  Gleason's grade 3+4 adenocarcinoma involving region of interest #2.  He also had an additional area of Gleason's grade 3+3 involving the left mid and left apex portions of the prostate gland.  He comes in now for high-intensity focused ultrasound (HIFU)  treatment.  PAST MEDICAL HISTORY:  No drug allergies.  CURRENT MEDICATIONS:  Eliquis, flecainide, losartan, metoprolol, calcium carbonate.  PAST SURGICAL HISTORY: 1.  Cardioversion, 2018 and 2019. 2.  Colon resection 2022.  PAST AND CURRENT MEDICAL CONDITIONS: 1.  Atrial fibrillation. 2.  Hypertension. 3.  Recent colon cancer.  REVIEW OF SYSTEMS:  The patient denies chest pain, shortness of breath, diabetes or stroke.  SOCIAL HISTORY:  The patient had tobacco use.  Consumes 40 alcoholic beverages per week.  FAMILY HISTORY:  Father died at age 103 of cerebral hemorrhage and hypertension.  Mother died at age 60 with dementia.  Brother died at age 81 of heart disease.  There is no family history of prostate cancer.  PHYSICAL EXAMINATION:   VITAL SIGNS:  Height was 5 feet 11 inches, weight 202 pounds, BMI 28. GENERAL:  Well-nourished white male in no acute distress. HEENT:  Sclerae were  clear.  Pupils are equally round, reactive to light and accommodation.  Extraocular movements are intact. NECK:  No palpable masses or tenderness.  No audible carotid bruits. LYMPHATIC:  No palpable inguinal or cervical adenopathy. PULMONARY:   Lungs clear to auscultation. CARDIOVASCULAR:  Regular rhythm and rate. ABDOMEN:  Soft, nontender abdomen. GENITOURINARY:  Circumcised.  Testes were smooth, nontender. RECTAL:  30 gram smooth nontender prostate. NEUROMUSCULAR: Exam grossly intact.  ASSESSMENT:  Stage T1c, Gleason grade 3+4 adenocarcinoma of the prostate.  PLAN:  High intensity focused ultrasound (HIFU) treatment of the prostate.  The patient was cleared by Dr. Ubaldo Glassing who is his cardiologist and instructed that he could safely stop Eliquis 2 days prior to the surgery.   SHY D: 01/09/2022 1:38:41 pm T: 01/09/2022 2:39:00 pm  JOB: [id number]/ [id number]

## 2022-01-09 NOTE — Patient Instructions (Addendum)
Your procedure is scheduled on: Thursday January 15, 2022. Report to Day Surgery inside [facility] 2nd floor, stop by admissions desk before getting on elevator. To find out your arrival time please call [phone] between 1PM - 3PM on Wednesday January 14, 2022.  Remember: Instructions that are not followed completely may result in serious medical risk,  up to and including death, or upon the discretion of your surgeon and anesthesiologist your  surgery may need to be rescheduled.     _X__ 1. Do not eat food after midnight the night before your procedure.                 No chewing gum or hard candies. You may drink clear liquids up to 2 hours                 before you are scheduled to arrive for your surgery- DO not drink clear                 liquids within 2 hours of the start of your surgery.                 Clear Liquids include:  water, apple juice without pulp, clear Gatorade, G2 or                  Gatorade Zero (avoid Red/Purple/Blue), Black Coffee or Tea (Do not add                 anything to coffee or tea).  __X__2.  On the morning of surgery brush your teeth with toothpaste and water, you                may rinse your mouth with mouthwash if you wish.  Do not swallow any toothpaste or mouthwash.     _X__ 3.  No Alcohol for 24 hours before or after surgery.   _X__ 4.  Do Not Smoke or use e-cigarettes For 24 Hours Prior to Your Surgery.                 Do not use any chewable tobacco products for at least 6 hours prior to                 Surgery.  _X__  5.  Do not use any recreational drugs (marijuana, cocaine, heroin, ecstasy, MDMA or other)                For at least one week prior to your surgery.  Combination of these drugs with anesthesia                May have life threatening results.  ____  6.  Bring all medications with you on the day of surgery if instructed.   __X__  7.  Notify your doctor if there is any change in your medical  condition      (cold, fever, infections).     Do not wear jewelry, make-up, hairpins, clips or nail polish. Do not wear lotions, powders, or perfumes. You may wear deodorant. Do not shave 48 hours prior to surgery. Men may shave face and neck. Do not bring valuables to the hospital.    [facility] is not responsible for any belongings or valuables.  Contacts, dentures or bridgework may not be worn into surgery. Leave your suitcase in the car. After surgery it may be brought to your room. For patients admitted to the hospital, discharge time is determined  by your treatment team.   Patients discharged the day of surgery will not be allowed to drive home.   Make arrangements for someone to be with you for the first 24 hours of your Same Day Discharge.   __X__ Take these medicines the morning of surgery with A SIP OF WATER:    1. diltiazem (CARDIZEM CD) 120  2. flecainide (TAMBOCOR)   3.   4.  5.  6.  __X__ Fleet Enema (as directed)   ____ Use CHG Soap (or wipes) as directed  ____ Use Benzoyl Peroxide Gel as instructed  ____ Use inhalers on the day of surgery  ____ Stop metformin 2 days prior to surgery    ____ Take 1/2 of usual insulin dose the night before surgery. No insulin the morning          of surgery.   __X__ Stop  ELIQUIS 5 MG TABS 5 days prior to your surgery as instructed by your doctor.  __X__ One Week prior to surgery- Stop Anti-inflammatories such as Ibuprofen, Aleve, Advil, Motrin, meloxicam (MOBIC), diclofenac, etodolac, ketorolac, Toradol, Daypro, piroxicam, Goody's or BC powders. OK TO USE TYLENOL IF NEEDED   __X__ Stop supplements until after surgery.    ____ Bring C-Pap to the hospital.    If you have any questions regarding your pre-procedure instructions,  Please call Pre-admit Testing at [phone]

## 2022-01-14 ENCOUNTER — Encounter: Payer: Self-pay | Admitting: Urology

## 2022-01-14 MED ORDER — FAMOTIDINE 20 MG PO TABS
20.0000 mg | ORAL_TABLET | Freq: Once | ORAL | Status: AC
  Administered 2022-01-15: 20 mg via ORAL

## 2022-01-14 MED ORDER — CEFAZOLIN SODIUM-DEXTROSE 1-4 GM/50ML-% IV SOLN
1.0000 g | Freq: Once | INTRAVENOUS | Status: AC
  Administered 2022-01-15: 1 g via INTRAVENOUS

## 2022-01-14 MED ORDER — ORAL CARE MOUTH RINSE: 15.0000 mL | Freq: Once | OROMUCOSAL | Status: AC

## 2022-01-14 MED ORDER — CHLORHEXIDINE GLUCONATE 0.12 % MT SOLN
15.0000 mL | Freq: Once | OROMUCOSAL | Status: AC
  Administered 2022-01-15: 15 mL via OROMUCOSAL

## 2022-01-14 MED ORDER — LACTATED RINGERS IV SOLN: INTRAVENOUS | Status: DC

## 2022-01-15 ENCOUNTER — Encounter
Admission: RE | Disposition: A | Discharge status: Home / Self Care | Payer: Self-pay | Source: Ambulatory Visit | Attending: Urology

## 2022-01-15 ENCOUNTER — Encounter: Payer: Self-pay | Admitting: Urology

## 2022-01-15 ENCOUNTER — Ambulatory Visit
Admission: RE | Admit: 2022-01-15 | Discharge: 2022-01-15 | Disposition: A | Discharge status: Home / Self Care | Payer: Medicare Other | Source: Ambulatory Visit | Attending: Urology | Admitting: Urology

## 2022-01-15 ENCOUNTER — Ambulatory Visit: Payer: Medicare Other | Admitting: Urgent Care

## 2022-01-15 DIAGNOSIS — C61 Malignant neoplasm of prostate: Secondary | ICD-10-CM | POA: Diagnosis present

## 2022-01-15 DIAGNOSIS — I10 Essential (primary) hypertension: Secondary | ICD-10-CM | POA: Insufficient documentation

## 2022-01-15 DIAGNOSIS — Z7901 Long term (current) use of anticoagulants: Secondary | ICD-10-CM | POA: Insufficient documentation

## 2022-01-15 DIAGNOSIS — K219 Gastro-esophageal reflux disease without esophagitis: Secondary | ICD-10-CM | POA: Insufficient documentation

## 2022-01-15 DIAGNOSIS — I2510 Atherosclerotic heart disease of native coronary artery without angina pectoris: Secondary | ICD-10-CM | POA: Insufficient documentation

## 2022-01-15 DIAGNOSIS — Z79899 Other long term (current) drug therapy: Secondary | ICD-10-CM | POA: Diagnosis not present

## 2022-01-15 DIAGNOSIS — Z85038 Personal history of other malignant neoplasm of large intestine: Secondary | ICD-10-CM | POA: Diagnosis not present

## 2022-01-15 DIAGNOSIS — I700 Atherosclerosis of aorta: Secondary | ICD-10-CM | POA: Diagnosis not present

## 2022-01-15 DIAGNOSIS — I4891 Unspecified atrial fibrillation: Secondary | ICD-10-CM | POA: Diagnosis not present

## 2022-01-15 HISTORY — PX: HIGH INTENSITY FOCUSED ULTRASOUND (HIFU) OF THE PROSTATE: SHX6793

## 2022-01-15 HISTORY — DX: Long term (current) use of anticoagulants: Z79.01

## 2022-01-15 SURGERY — ABLATION, PROSTATE, RECTAL APPROACH, USING HIGH-INTENSITY FOCUSED ULTRASOUND
Anesthesia: General | Site: Prostate

## 2022-01-15 MED ORDER — ACETAMINOPHEN 10 MG/ML IV SOLN
INTRAVENOUS | Status: AC
  Filled 2022-01-15: qty 100

## 2022-01-15 MED ORDER — FENTANYL CITRATE (PF) 100 MCG/2ML IJ SOLN
INTRAMUSCULAR | Status: DC | PRN
  Administered 2022-01-15: 50 ug via INTRAVENOUS
  Administered 2022-01-15 (×2): 25 ug via INTRAVENOUS

## 2022-01-15 MED ORDER — ACETAMINOPHEN 10 MG/ML IV SOLN
INTRAVENOUS | Status: DC | PRN
  Administered 2022-01-15: 1000 mg via INTRAVENOUS

## 2022-01-15 MED ORDER — CHLORHEXIDINE GLUCONATE 0.12 % MT SOLN
OROMUCOSAL | Status: AC
  Filled 2022-01-15: qty 15

## 2022-01-15 MED ORDER — SUGAMMADEX SODIUM 200 MG/2ML IV SOLN
INTRAVENOUS | Status: DC | PRN
  Administered 2022-01-15: 300 mg via INTRAVENOUS

## 2022-01-15 MED ORDER — HYOSCYAMINE SULFATE SL 0.125 MG SL SUBL: 0.1250 mg | SUBLINGUAL_TABLET | SUBLINGUAL | 3 refills | Status: DC | PRN

## 2022-01-15 MED ORDER — GLYCOPYRROLATE 0.2 MG/ML IJ SOLN
INTRAMUSCULAR | Status: DC | PRN
Start: 2022-01-15 — End: 2022-01-15
  Administered 2022-01-15 (×2): .2 mg via INTRAVENOUS

## 2022-01-15 MED ORDER — PHENYLEPHRINE 40 MCG/ML (10ML) SYRINGE FOR IV PUSH (FOR BLOOD PRESSURE SUPPORT): PREFILLED_SYRINGE | INTRAVENOUS | Status: DC | PRN

## 2022-01-15 MED ORDER — PROPOFOL 10 MG/ML IV BOLUS
INTRAVENOUS | Status: DC | PRN
Start: 2022-01-15 — End: 2022-01-15
  Administered 2022-01-15: 130 mg via INTRAVENOUS

## 2022-01-15 MED ORDER — CEFAZOLIN SODIUM-DEXTROSE 1-4 GM/50ML-% IV SOLN
INTRAVENOUS | Status: AC
  Filled 2022-01-15: qty 50

## 2022-01-15 MED ORDER — FENTANYL CITRATE (PF) 100 MCG/2ML IJ SOLN
INTRAMUSCULAR | Status: AC
  Filled 2022-01-15: qty 2

## 2022-01-15 MED ORDER — EPHEDRINE SULFATE (PRESSORS) 50 MG/ML IJ SOLN
INTRAMUSCULAR | Status: DC | PRN
  Administered 2022-01-15: 5 mg via INTRAVENOUS

## 2022-01-15 MED ORDER — FENTANYL CITRATE (PF) 100 MCG/2ML IJ SOLN: 25.0000 ug | INTRAMUSCULAR | Status: DC | PRN

## 2022-01-15 MED ORDER — ROCURONIUM BROMIDE 100 MG/10ML IV SOLN
INTRAVENOUS | Status: DC | PRN
Start: 2022-01-15 — End: 2022-01-15
  Administered 2022-01-15 (×3): 10 mg via INTRAVENOUS
  Administered 2022-01-15: 15 mg via INTRAVENOUS
  Administered 2022-01-15: 20 mg via INTRAVENOUS
  Administered 2022-01-15: 50 mg via INTRAVENOUS
  Administered 2022-01-15: 5 mg via INTRAVENOUS

## 2022-01-15 MED ORDER — DOCUSATE SODIUM 100 MG PO CAPS: 200.0000 mg | ORAL_CAPSULE | Freq: Two times a day (BID) | ORAL | 3 refills | Status: DC

## 2022-01-15 MED ORDER — DEXAMETHASONE SODIUM PHOSPHATE 10 MG/ML IJ SOLN
INTRAMUSCULAR | Status: DC | PRN
  Administered 2022-01-15: 4 mg via INTRAVENOUS

## 2022-01-15 MED ORDER — PHENYLEPHRINE HCL-NACL 20-0.9 MG/250ML-% IV SOLN
INTRAVENOUS | Status: DC | PRN
  Administered 2022-01-15: 25 ug/min via INTRAVENOUS

## 2022-01-15 MED ORDER — OXYCODONE HCL 5 MG/5ML PO SOLN: 5.0000 mg | Freq: Once | ORAL | Status: DC | PRN

## 2022-01-15 MED ORDER — FAMOTIDINE 20 MG PO TABS
ORAL_TABLET | ORAL | Status: AC
  Filled 2022-01-15: qty 1

## 2022-01-15 MED ORDER — ONDANSETRON HCL 4 MG/2ML IJ SOLN
INTRAMUSCULAR | Status: DC | PRN
  Administered 2022-01-15: 4 mg via INTRAVENOUS

## 2022-01-15 MED ORDER — NITROFURANTOIN MACROCRYSTAL 100 MG PO CAPS: 100.0000 mg | ORAL_CAPSULE | Freq: Two times a day (BID) | ORAL | 1 refills | Status: DC

## 2022-01-15 MED ORDER — OXYCODONE HCL 5 MG PO TABS: 5.0000 mg | ORAL_TABLET | Freq: Once | ORAL | Status: DC | PRN

## 2022-01-15 MED ORDER — TAMSULOSIN HCL 0.4 MG PO CAPS
0.4000 mg | ORAL_CAPSULE | Freq: Every day | ORAL | 11 refills | Status: DC
Start: 2022-01-15 — End: 2023-05-06

## 2022-01-15 MED ORDER — LIDOCAINE HCL (CARDIAC) PF 100 MG/5ML IV SOSY
PREFILLED_SYRINGE | INTRAVENOUS | Status: DC | PRN
  Administered 2022-01-15: 80 mg via INTRAVENOUS

## 2022-01-15 MED ORDER — PHENYLEPHRINE HCL (PRESSORS) 10 MG/ML IV SOLN
INTRAVENOUS | Status: DC | PRN
  Administered 2022-01-15 (×3): 100 ug via INTRAVENOUS

## 2022-01-15 SURGICAL SUPPLY — 10 items
BAG URINE LEG 25OZ (MISCELLANEOUS) ×6 IMPLANT
CATH FOL 2WAY LX 16X5 (CATHETERS) ×2 IMPLANT
FEE RENTAL SONABLATE (MISCELLANEOUS) IMPLANT
FEE SONABLATE RENTAL (MISCELLANEOUS) ×2 IMPLANT
FEE SONABLATE TECHNICIAN (MISCELLANEOUS) ×2 IMPLANT
FEE TECHNICIAN SONABLATE (MISCELLANEOUS) IMPLANT
GEL ULTRASOUND 20GR AQUASONIC (MISCELLANEOUS) ×2 IMPLANT
HOLDER FOLEY CATH W/STRAP (MISCELLANEOUS) ×2 IMPLANT
PACK CYSTO AR (MISCELLANEOUS) ×2 IMPLANT
PACK PROSTATE SONABLATE INSERT (MISCELLANEOUS) ×1 IMPLANT

## 2022-01-15 NOTE — Op Note (Signed)
Preoperative diagnosis: Prostate cancer (C61)  Postoperative diagnosis: Same  Procedure: 1.  High intensity focused ultrasound(HIFU) treatment of the prostate(CPT [phone])                      2.  Foley catheter placement (CPT [phone])  Surgeon: Otelia Limes. Yves Dill MD  Anesthesia: General  Indications:See the history and physical also. 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  He initially chose active surveillance, but his PSA increased up to 6.8 ng on 06/23/2021 and an MRI scan on 07/02/2021 showed 2 PI-RADS category 4 lesions on the left side and a 30 mL prostate.  Biopsy on 12/11/2021 revealed a Gleason's grade 3+3 adenocarcinoma involving region of interest #1 and Gleason's grade 3+4 adenocarcinoma involving region of interest #2.  He also had an additional area of Gleason's grade 3+3 involving the left mid and left apex portions of the prostate gland.  He comes in now for high-intensity focused ultrasound (HIFU) treatment.After informed consent the above procedure(s) were requested     Technique and findings: The patient was identified by his name bracelet and was taken to the operating room.  After standard timeout was performed the staff present agreed to the procedure to be performed.  After adequate general anesthesia obtained patient was placed into the lithotomy position and the perineum was prepped and draped in the usual fashion.  An 9 French Foley catheter was placed in sterile fashion.  The transrectal Sonablate probe was inserted and prostate measured.  Images were taken in both transverse and sagittal views.  A treatment plan was programmed in 3 zones with an additional drop zone.  The urethra and nerves were spared.  The power levels were selected based upon rectal wall distance.  The temperature of the rectal probe and reflectivity index were  maintained within normal range throughout the treatment.  At the conclusion of treatment the rectal probe was removed.  The procedure was then terminated and patient transferred to the recovery room in stable condition.

## 2022-01-15 NOTE — Discharge Instructions (Addendum)
Indwelling Urinary Catheter Care, Adult An indwelling urinary catheter is a thin tube that is put into your bladder. The tube helps to drain pee (urine) out of your body. The tube goes in through your urethra. Your urethra is where pee comes out of your body. Your pee will come out through the catheter, then it will go into a bag (drainage bag). Take good care of your catheter so it will work well. What are the risks? Germs may get into your bladder and cause an infection. The tube can become blocked. Tissue near the catheter may become irritated and may bleed. How to wear your catheter and drainage bag Supplies needed Sticky tape (adhesive tape) or a leg strap. Alcohol wipe or soap and water (if you use tape). A clean towel (if you use tape). Large overnight bag. Smaller bag (leg bag). Wearing your catheter Attach your catheter to your leg with tape or a leg strap. Make sure the catheter is not pulled tight. If a leg strap gets wet, take it off and put on a dry strap. If you use tape to hold the bag on your leg: Use an alcohol wipe or soap and water to wash your skin where the tape made it sticky before. Use a clean towel to pat-dry that skin. Use new tape to make the bag stay on your leg. Wearing your bags You should have been given a large overnight bag. You may wear the overnight bag in the day or night. Always have the overnight bag lower than your bladder.  Do not let the bag touch the floor. Before you go to sleep, put a clean plastic bag in a wastebasket. Then, hang the overnight bag inside the wastebasket. You should also have a smaller leg bag that fits under your clothes. Wear the leg bag as told by the product maker. This may be above or below the knee, depending on the length of the tubing. Make sure that the leg bag is below the bladder. Make sure that the tubing does not have loops or too much tension. Do not wear your leg bag at night. How to care for your skin and  catheter Supplies needed A clean washcloth. Water and mild soap. A clean towel. Caring for your skin and catheter Male anatomy showing the labia, urethra, and an indwelling urinary catheter in the bladder.    Male anatomy showing the penis, urethra, and an indwelling urinary catheter in the bladder.  Clean the skin around your catheter every day. Wash your hands with soap and water. Wet a clean washcloth in warm water and mild soap. Clean the skin around your urethra. If you are male: Gently spread the folds of skin around your vagina (labia). With the washcloth in your other hand, wipe the inner side of your labia on each side. Wipe from front to back. If you are male: Pull back any skin that covers the end of your penis (foreskin). With the washcloth in your other hand, wipe your penis in small circles. Start wiping at the tip of your penis, then move away from the catheter. Move the foreskin back in place, if needed. With your free hand, hold the catheter close to where it goes into your body. Keep holding the catheter during cleaning so it does not get pulled out. With the washcloth in your other hand, clean the catheter. Only wipe downward on the catheter, toward the drainage bag. Do not wipe upward toward your body. Doing this may push  germs into your urethra and cause infection. Use a clean towel to pat-dry the catheter and the skin around it. Make sure to wipe off all soap. Wash your hands with soap and water. Shower every day. Do not take baths. Do not use cream, ointment, or lotion on the area where the catheter goes into your body, unless your doctor tells you to. Do not use powders, sprays, or lotions on your genital area. Check your skin around the catheter every day for signs of infection. Check for: Redness, swelling, or pain. Fluid or blood. Warmth. Pus or a bad smell. How to empty the bag Supplies needed Rubbing alcohol. Gauze pad or cotton ball. Tape or  a leg strap. Emptying the bag Pour the pee out of your bag when it is ?- full, or at least 2-3 times a day. Do this for your overnight bag and your leg bag. Wash your hands with soap and water. Separate (detach) the bag from your leg. Hold the bag over the toilet or a clean pail. Keep the bag lower than your hips and bladder. This is so the pee (urine) does not go back into the tube. Open the pour spout. It is at the bottom of the bag. Empty the pee into the toilet or pail. Do not let the pour spout touch any surface. Put rubbing alcohol on a gauze pad or cotton ball. Use the gauze pad or cotton ball to clean the pour spout. Close the pour spout. Attach the bag to your leg with tape or a leg strap. Wash your hands with soap and water. Follow instructions for cleaning the drainage bag. Instructions can come from: The product maker. Your doctor. How to change the bag Changing the bag Replace your bag when it starts to leak, smell bad, or look dirty. Wash your hands with soap and water. Separate the dirty bag from your leg. Pinch the catheter with your fingers so that pee does not spill out. Separate the catheter tube from the bag tube where these tubes connect (at the connection valve). Do not let the tubes touch any surface. Clean the end of the catheter tube with an alcohol wipe. Use a different alcohol wipe to clean the end of the bag tube. Connect the catheter tube to the tube of the clean bag. Attach the clean bag to your leg with tape or a leg strap. Do not make the bag tight on your leg. Wash your hands with soap and water. General instructions A person washing hands with soap and water.  Never pull on your catheter. Never try to take it out. Doing that can hurt you. Always wash your hands before and after you touch your catheter or bag. Use a mild, fragrance-free soap. If you do not have soap and water, use hand sanitizer. Always make sure there are no twists, bends, or kinks  in the catheter tube. Always make sure there are no leaks in the catheter or bag. Drink enough fluid to keep your pee pale yellow. Do not take baths, swim, or use a hot tub. If you are male, wipe from front to back after you poop (have a bowel movement). Contact a doctor if: Your catheter gets clogged. Your catheter leaks. You have signs of infection at the catheter site, such as: Redness, swelling, or pain where the catheter goes into your body. Fluid, blood, pus, or a bad smell coming from the area where the catheter goes into your body. Skin feels warm where the  catheter goes into your body. You have signs of a bladder infection, such as: Fever. Chills. Pee smells worse than usual. Cloudy pee. Pain in your belly, legs, lower back, or bladder. Vomiting or feel like vomiting. Get help right away if: You see blood in the catheter. Your pee is pink or red. Your bladder feels full. Your pee is not draining into the bag. Your catheter gets pulled out. Summary An indwelling urinary catheter is a thin tube that is placed into the bladder to help drain pee (urine) out of the body. The catheter is placed into the part of the body that drains pee from the bladder (urethra). Taking good care of your catheter will keep it working well. Always wash your hands before and after touching your catheter or bag. Never pull on your catheter or try to take it out. This information is not intended to replace advice given to you by your health care provider. Make sure you discuss any questions you have with your health care provider. Document Revised: 07/17/2021 Document Reviewed: 07/17/2021 Elsevier Patient Education  2022 [place]   The drugs that you were given will stay in your system until tomorrow so for the next 24 hours you should not:  Drive an automobile Make any legal decisions Drink any alcoholic beverage   You may resume  regular meals tomorrow.  Today it is better to start with liquids and gradually work up to solid foods.  You may eat anything you prefer, but it is better to start with liquids, then soup and crackers, and gradually work up to solid foods.   Please notify your doctor immediately if you have any unusual bleeding, trouble breathing, redness and pain at the surgery site, drainage, fever, or pain not relieved by medication.    Additional Instructions:        Please contact your physician with any problems or Same Day Surgery at [phone], Monday through Friday 6 am to 4 pm, or [place] [facility] number at [phone].

## 2022-01-15 NOTE — Anesthesia Postprocedure Evaluation (Signed)
Anesthesia Post Note  Patient: RICARDO SCHUBACH  Procedure(s) Performed: HIGH INTENSITY FOCUSED ULTRASOUND (HIFU) OF THE PROSTATE (Prostate)  Patient location during evaluation: PACU Anesthesia Type: General Level of consciousness: awake and alert Pain management: pain level controlled Vital Signs Assessment: post-procedure vital signs reviewed and stable Respiratory status: spontaneous breathing, nonlabored ventilation, respiratory function stable and patient connected to nasal cannula oxygen Cardiovascular status: blood pressure returned to baseline and stable Postop Assessment: no apparent nausea or vomiting Anesthetic complications: no   No notable events documented.   Last Vitals:  Vitals:   01/15/22 1648 01/15/22 1712  BP: 129/74 138/73  Pulse: (!) 56 (!) 59  Resp: 16 16  Temp: (!) 36.1 C   SpO2: 100% 100%    Last Pain:  Vitals:   01/15/22 1712  TempSrc:   PainSc: 0-No pain                 Martha Clan

## 2022-01-15 NOTE — Transfer of Care (Signed)
Immediate Anesthesia Transfer of Care Note  Patient: Edwin [name]  Procedure(s) Performed: HIGH INTENSITY FOCUSED ULTRASOUND (HIFU) OF THE PROSTATE (Prostate)  Patient Location: PACU  Anesthesia Type:General  Level of Consciousness: drowsy  Airway & Oxygen Therapy: Patient Spontanous Breathing and Patient connected to face mask oxygen  Post-op Assessment: Report given to RN  Post vital signs: stable  Last Vitals:  Vitals Value Taken Time  BP 122/74 01/15/22 1604  Temp    Pulse 56 01/15/22 1606  Resp 9 01/15/22 1606  SpO2 100 % 01/15/22 1606  Vitals shown include unvalidated device data.  Last Pain:  Vitals:   01/15/22 1101  TempSrc: Oral  PainSc: 0-No pain         Complications: No notable events documented.

## 2022-01-15 NOTE — Anesthesia Procedure Notes (Signed)
Procedure Name: Intubation Date/Time: 01/15/2022 12:48 PM Performed by: Lowry Bowl, CRNA Pre-anesthesia Checklist: Patient identified, Emergency Drugs available, Suction available and Patient being monitored Patient Re-evaluated:Patient Re-evaluated prior to induction Oxygen Delivery Method: Circle system utilized Preoxygenation: Pre-oxygenation with 100% oxygen Induction Type: IV induction Ventilation: Mask ventilation without difficulty Laryngoscope Size: McGraph and 4 Grade View: Grade I Tube type: Oral Tube size: 7.5 mm Number of attempts: 1 Airway Equipment and Method: Stylet and Video-laryngoscopy Placement Confirmation: ETT inserted through vocal cords under direct vision, positive ETCO2 and breath sounds checked- equal and bilateral Secured at: 23 cm Tube secured with: Tape Dental Injury: Teeth and Oropharynx as per pre-operative assessment

## 2022-01-15 NOTE — H&P (Signed)
Date of Initial H&P: 01/09/22  History reviewed, patient examined, no change in status, stable for surgery.

## 2022-01-15 NOTE — Anesthesia Preprocedure Evaluation (Signed)
Anesthesia Evaluation  Patient identified by MRN, date of birth, ID band Patient awake    Reviewed: Allergy & Precautions, NPO status , Patient's Chart, lab work & pertinent test results  History of Anesthesia Complications (+) Emergence Delirium and history of anesthetic complications  Airway Mallampati: III  TM Distance: >3 FB Neck ROM: full    Dental  (+) Chipped   Pulmonary neg pulmonary ROS, neg shortness of breath,    Pulmonary exam normal        Cardiovascular Exercise Tolerance: Good hypertension, (-) angina+ CAD  Normal cardiovascular exam     Neuro/Psych negative neurological ROS  negative psych ROS   GI/Hepatic Neg liver ROS, GERD  Controlled,  Endo/Other  negative endocrine ROS  Renal/GU      Musculoskeletal   Abdominal   Peds  Hematology negative hematology ROS (+)   Anesthesia Other Findings Past Medical History: No date: Acid reflux 08/06/2021: Adenocarcinoma of colon ([place])     Comment:  a.) CT on 07/10/2021 --> 20-25 cm from anal verge;               measured 3.7 x 2.7 cm.  b.) pathology (+) for invasive               moderately differentiated adenocarcinoma. No date: Aortic atherosclerosis ([place]) No date: Atherosclerosis of coronary artery     Comment:  a.) CT chest on 08/14/2021 --> moderate. No date: Atrial fibrillation and flutter ([name])     Comment:  a.) CHA2DS2VASc = 3 (age, HTN, aortic plaque). b.)               rate/rhythm maintained on oral diltiazem + flecanide;               chronically anticoagulated using apixaban. No date: Hypertension No date: Lesion of right lobe of liver     Comment:  a.) CT on 07/10/2021 --> 1.0 cm and 0.8 cm lesions in               anterior RIGHT lobe. No date: Long term current use of anticoagulant     Comment:  a.) apixaban No date: LVH (left ventricular hypertrophy)     Comment:  a.) TTE on 09/16/2017 - mild No date: Prostate cancer ([place])      Comment:  a.) Bx (+) for acinar adenocarcinoma No date: Valvular regurgitation     Comment:  a.) TTE on 09/23/2017 --> LVEF 55%; mild MV, TV, PV  Past Surgical History: 11/10/2017: CARDIOVERSION; N/A     Comment:  Procedure: CARDIOVERSION;  Surgeon: Teodoro Spray, MD;              Location: [facility];  Service: Cardiovascular;                Laterality: N/A; 05/27/2018: CARDIOVERSION; N/A     Comment:  Procedure: CARDIOVERSION;  Surgeon: Teodoro Spray, MD;              Location: [name];  Service: Cardiovascular;                Laterality: N/A; 08/06/2021: COLONOSCOPY WITH PROPOFOL; N/A     Comment:  Procedure: COLONOSCOPY WITH PROPOFOL;  Surgeon: Robert Bellow, MD;  Location: [name];  Service:               Endoscopy;  Laterality: N/A; 06/06/2020: PROSTATE BIOPSY; N/A  Comment:  Procedure: PROSTATE BIOPSY URONAV;  Surgeon: Royston Cowper, MD;  Location: [name];  Service: Urology;                Laterality: N/A; 12/11/2021: PROSTATE BIOPSY; N/A     Comment:  Procedure: PROSTATE BIOPSY URONAV;  Surgeon: Royston Cowper, MD;  Location: [name];  Service: Urology;                Laterality: N/A;  BMI    Body Mass Index: 24.80 kg/m      Reproductive/Obstetrics negative OB ROS                             Anesthesia Physical Anesthesia Plan  ASA: 3  Anesthesia Plan: General ETT   Post-op Pain Management:    Induction: Intravenous  PONV Risk Score and Plan: Ondansetron, Dexamethasone, Midazolam and Treatment may vary due to age or medical condition  Airway Management Planned: Oral ETT  Additional Equipment:   Intra-op Plan:   Post-operative Plan: Extubation in OR  Informed Consent: I have reviewed the patients History and Physical, chart, labs and discussed the procedure including the risks, benefits and alternatives for the proposed anesthesia with the patient or authorized  representative who has indicated his/her understanding and acceptance.     Dental Advisory Given  Plan Discussed with: Anesthesiologist, CRNA and Surgeon  Anesthesia Plan Comments: (Patient consented for risks of anesthesia including but not limited to:  - adverse reactions to medications - damage to eyes, teeth, lips or other oral mucosa - nerve damage due to positioning  - sore throat or hoarseness - Damage to heart, brain, nerves, lungs, other parts of body or loss of life  Patient voiced understanding.)        Anesthesia Quick Evaluation

## 2022-01-16 ENCOUNTER — Encounter: Payer: Self-pay | Admitting: Urology

## 2022-02-17 ENCOUNTER — Encounter: Payer: Self-pay | Admitting: Urology

## 2022-08-14 ENCOUNTER — Other Ambulatory Visit: Payer: Self-pay | Admitting: General Surgery

## 2022-08-14 NOTE — Progress Notes (Signed)
Progress Notes - documented in this encounter Edwin [name], Edwin Boot, MD - 08/13/2022 3:30 PM EDT Formatting of this note is different from the original. Subjective:   Patient ID: Edwin [name] is a 71 y.o. male.  HPI  The following portions of the patient's history were reviewed and updated as appropriate.  This an established patient is here today for: office visit. The patient is here today to discuss having a colonoscopy. Patient had a colectomy done by Dr. Peyton [name] in September of 2022.   The patient has had intermittent atrial fibrillation with cardioversion in 2019. Last cardiology evaluation June, 2023 with Edwin Bill, MD.  Chief Complaint  Patient presents with  Colonoscopy    BP (!) 179/100  Pulse 59  Temp 36.3 C (97.3 F)  Ht 182.9 cm (6')  Wt 88.8 kg (195 lb 12.8 oz)  BMI 26.56 kg/m   Manual blood pressure: 170/90.  Past Medical History:  Diagnosis Date  Atrial fibrillation, persistent (CMS-[name])  HTN (hypertension)  Prostate cancer (CMS-[name]) 06/06/2020  Biopsy completed by Edwin Park, MD    Past Surgical History:  Procedure Laterality Date  CARDIOVERSION EXTERNAL 11/10/2017  CARDIOVERSION EXTERNAL 05/27/2018  BIOPSY PROSTATE INCISIONAL 06/06/2020  COLONOSCOPY 08/06/2021  adenocarcinoma/Referred to Dr. Peyton [name] for resection/JWB  sigmoid colectomy 08/27/2021  Dr Edwin [name] -- ROBOTIC    Social History   Socioeconomic History  Marital status: Married  Tobacco Use  Smoking status: Never  Smokeless tobacco: Never  Vaping Use  Vaping Use: Never used  Substance and Sexual Activity  Alcohol use: Yes  Alcohol/week: 42.0 standard drinks  Types: 42 Cans of beer per week  Comment: drinks 6 beers a day  Drug use: No    No Known Allergies  Current Outpatient Medications  Medication Sig Dispense Refill  calcium carbonate 500 mg calcium (1,250 mg) chewable tablet Take 500 mg of elemental by mouth 2 (two) times daily with meals   dilTIAZem (CARDIZEM CD) 120 MG XR capsule TAKE 1 CAPSULE BY MOUTH ONCE DAILY 90 capsule 3  ELIQUIS 5 mg tablet TAKE 1 TABLET BY MOUTH TWICE A DAY 180 tablet 3  flecainide (TAMBOCOR) 100 MG tablet TAKE 1 TABLET BY MOUTH TWICE A DAY 180 tablet 1  losartan (COZAAR) 50 MG tablet TAKE 1 TABLET EVERY DAY 90 tablet 3  metoprolol succinate (TOPROL-XL) 25 MG XL tablet TAKE 1 TABLET BY MOUTH EVERY DAY 90 tablet 3  doxazosin (CARDURA) 4 MG tablet TAKE 1 TABLET (4 MG TOTAL) BY MOUTH NIGHTLY TAKE ONE TAKE DAILY (Patient not taking: Reported on 08/13/2022) 90 tablet 2   No current facility-administered medications for this visit.   No family history on file.  Labs and Radiology:   August 27, 2021 robotic assisted sigmoid colectomy:  4 cm intermediate grade adenocarcinoma. 0/14 nodes positive. Negative margins. PT3, N0.  December 05, 2021 laboratory:  WBC 4.0 - 10.5 K/uL 7.7  RBC 4.22 - 5.81 MIL/uL 3.92 Low  Hemoglobin 13.0 - 17.0 g/dL 12.6 Low  HCT 39.0 - 52.0 % 37.0 Low  MCV 80.0 - 100.0 fL 94.4  MCH 26.0 - 34.0 pg 32.1  MCHC 30.0 - 36.0 g/dL 34.1  RDW 11.5 - 15.5 % 13.0  Platelets 150 - 400 K/uL 217  nRBC 0.0 - 0.2 % 0.0   Sodium 135 - 145 mmol/L 131 Low  Potassium 3.5 - 5.1 mmol/L 4.4  Chloride 98 - 111 mmol/L 94 Low  CO2 22 - 32 mmol/L 29  Glucose, Bld 70 - 99 mg/dL 105  High  Comment: Glucose reference range applies only to samples taken after fasting for at least 8 hours.  BUN 8 - 23 mg/dL 13  Creatinine, Ser 0.61 - 1.24 mg/dL 1.07  Calcium 8.9 - 10.3 mg/dL 9.8  GFR, Estimated >60 mL/min >60   Review of Systems  Constitutional: Negative for chills and fever.  Respiratory: Negative for cough.  He has  Objective:  Physical Exam Constitutional:  Appearance: Normal appearance.  Cardiovascular:  Rate and Rhythm: Normal rate and regular rhythm.  Pulses: Normal pulses.  Heart sounds: Normal heart sounds.  Pulmonary:  Effort: Pulmonary effort is normal.  Breath sounds: Normal  breath sounds.  Musculoskeletal:  Cervical back: Neck supple.  Neurological:  Mental Status: He is alert and oriented to person, place, and time.  Psychiatric:  Mood and Affect: Mood normal.  Behavior: Behavior normal.    Assessment:   Doing well 1 year status post sigmoid resection for a T3, N0 carcinoma.  61-monthstatus post high-frequency ablation of the prostate.  Intermittent atrial fibrillation/flutter. Longstanding Eliquis therapy.  Symptoms of postural hypotension, resolved with discontinuation of Cardura. Note blood pressure at today's visit.  Plan:   Candidate for surveillance colonoscopy.  Patient will hold Eliquis therapy for 48 hours prior to the procedure. As he is in sinus rhythm today, likelihood of embolic event is small.  Will notify Dr. FUbaldo [name] elevated BP since Cardura held.   This note is partially prepared by MLedell Noss CMA acting as a scribe in the presence of Dr. JHervey Ard MD.   The documentation recorded by the scribe accurately reflects the service I personally performed and the decisions made by me.   JRobert Bellow MD FACS  Electronically signed by BMayer Masker MD at 08/13/2022 4:05 PM EDT

## 2022-09-01 ENCOUNTER — Encounter: Payer: Self-pay | Admitting: General Surgery

## 2022-09-02 ENCOUNTER — Encounter
Admission: RE | Disposition: A | Discharge status: Home / Self Care | Payer: Self-pay | Source: Home / Self Care | Attending: General Surgery

## 2022-09-02 ENCOUNTER — Ambulatory Visit: Payer: Medicare Other | Admitting: Anesthesiology

## 2022-09-02 ENCOUNTER — Ambulatory Visit
Admission: RE | Admit: 2022-09-02 | Discharge: 2022-09-02 | Disposition: A | Discharge status: Home / Self Care | Payer: Medicare Other | Attending: General Surgery | Admitting: General Surgery

## 2022-09-02 DIAGNOSIS — Z9049 Acquired absence of other specified parts of digestive tract: Secondary | ICD-10-CM | POA: Diagnosis not present

## 2022-09-02 DIAGNOSIS — I10 Essential (primary) hypertension: Secondary | ICD-10-CM | POA: Diagnosis not present

## 2022-09-02 DIAGNOSIS — I2510 Atherosclerotic heart disease of native coronary artery without angina pectoris: Secondary | ICD-10-CM | POA: Diagnosis not present

## 2022-09-02 DIAGNOSIS — Q438 Other specified congenital malformations of intestine: Secondary | ICD-10-CM | POA: Diagnosis not present

## 2022-09-02 DIAGNOSIS — K621 Rectal polyp: Secondary | ICD-10-CM | POA: Insufficient documentation

## 2022-09-02 DIAGNOSIS — Z08 Encounter for follow-up examination after completed treatment for malignant neoplasm: Secondary | ICD-10-CM | POA: Insufficient documentation

## 2022-09-02 DIAGNOSIS — K219 Gastro-esophageal reflux disease without esophagitis: Secondary | ICD-10-CM | POA: Diagnosis not present

## 2022-09-02 DIAGNOSIS — Z85038 Personal history of other malignant neoplasm of large intestine: Secondary | ICD-10-CM | POA: Diagnosis not present

## 2022-09-02 HISTORY — PX: COLONOSCOPY WITH PROPOFOL: SHX5780

## 2022-09-02 SURGERY — COLONOSCOPY WITH PROPOFOL
Anesthesia: General

## 2022-09-02 MED ORDER — GLYCOPYRROLATE 0.2 MG/ML IJ SOLN
INTRAMUSCULAR | Status: DC | PRN
  Administered 2022-09-02: .2 mg via INTRAVENOUS

## 2022-09-02 MED ORDER — PROPOFOL 10 MG/ML IV BOLUS
INTRAVENOUS | Status: DC | PRN
  Administered 2022-09-02: 80 mg via INTRAVENOUS

## 2022-09-02 MED ORDER — ELIQUIS 5 MG PO TABS: 5.0000 mg | ORAL_TABLET | Freq: Two times a day (BID) | ORAL | 11 refills | Status: AC

## 2022-09-02 MED ORDER — EPHEDRINE SULFATE (PRESSORS) 50 MG/ML IJ SOLN
INTRAMUSCULAR | Status: DC | PRN
  Administered 2022-09-02 (×2): 5 mg via INTRAVENOUS

## 2022-09-02 MED ORDER — SODIUM CHLORIDE 0.9 % IV SOLN: INTRAVENOUS | Status: DC

## 2022-09-02 MED ORDER — PROPOFOL 500 MG/50ML IV EMUL
INTRAVENOUS | Status: DC | PRN
  Administered 2022-09-02: 130 ug/kg/min via INTRAVENOUS

## 2022-09-02 MED ORDER — LIDOCAINE HCL (CARDIAC) PF 100 MG/5ML IV SOSY
PREFILLED_SYRINGE | INTRAVENOUS | Status: DC | PRN
  Administered 2022-09-02: 50 mg via INTRAVENOUS

## 2022-09-02 NOTE — Op Note (Signed)
[facility] Patient Name: Edwin [name] Procedure Date: 09/02/2022 8:58 AM MRN: [id number] Account #: [id number] Date of Birth: 07-10-1951 Admit Type: Outpatient Age: 71 Room: [facility] ENDO ROOM 1 Gender: Male Note Status: Finalized Instrument Name: Peds Colonoscope [id number] Procedure:             Colonoscopy Indications:           High risk colon cancer surveillance: Personal history                         of colon cancer Providers:             Robert Bellow, MD Referring MD:          Leona Carry. Hall Busing, MD (Referring MD) Medicines:             Propofol per Anesthesia Complications:         No immediate complications. Procedure:             Pre-Anesthesia Assessment:                        - Prior to the procedure, a History and Physical was                         performed, and patient medications, allergies and                         sensitivities were reviewed. The patient's tolerance                         of previous anesthesia was reviewed.                        - The risks and benefits of the procedure and the                         sedation options and risks were discussed with the                         patient. All questions were answered and informed                         consent was obtained.                        After obtaining informed consent, the colonoscope was                         passed under direct vision. Throughout the procedure,                         the patient's blood pressure, pulse, and oxygen                         saturations were monitored continuously. The                         Colonoscope was introduced through the anus and  advanced to the the cecum, identified by appendiceal                         orifice and ileocecal valve. The colonoscopy was                         somewhat difficult due to a tortuous colon. Successful                         completion of the procedure  was aided by using manual                         pressure. The patient tolerated the procedure well.                         The quality of the bowel preparation was adequate to                         identify polyps. Findings:      A 5 mm polyp was found in the rectum. The polyp was sessile. Biopsies       were taken with a cold forceps for histology.      The retroflexed view of the distal rectum and anal verge was normal and       showed no anal or rectal abnormalities. Impression:            - One 5 mm polyp in the rectum. Biopsied.                        - The distal rectum and anal verge are normal on                         retroflexion view. Recommendation:        - Telephone endoscopist for pathology results in 1                         week.                        Resume Eliquis Thursday, October 5 with AM dose. Procedure Code(s):     --- Professional ---                        [phone], Colonoscopy, flexible; with biopsy, single or                         multiple Diagnosis Code(s):     --- Professional ---                        D53.299, Personal history of other malignant neoplasm                         of large intestine                        K62.1, Rectal polyp CPT copyright 2019 [employer]. All rights reserved. The codes documented in this report are preliminary and upon coder review may  be revised to meet current compliance requirements. Robert Bellow,  MD 09/02/2022 9:47:46 AM This report has been signed electronically. Number of Addenda: 0 Note Initiated On: 09/02/2022 8:58 AM Scope Withdrawal Time: 0 hours 15 minutes 51 seconds  Total Procedure Duration: 0 hours 30 minutes 53 seconds  Estimated Blood Loss:  Estimated blood loss was minimal.      [facility]

## 2022-09-02 NOTE — Anesthesia Preprocedure Evaluation (Signed)
Anesthesia Evaluation  Patient identified by MRN, date of birth, ID band Patient awake    Reviewed: Allergy & Precautions, NPO status , Patient's Chart, lab work & pertinent test results  History of Anesthesia Complications (+) Emergence Delirium and history of anesthetic complications  Airway Mallampati: III  TM Distance: >3 FB Neck ROM: full    Dental  (+) Chipped   Pulmonary neg pulmonary ROS, neg shortness of breath,    Pulmonary exam normal        Cardiovascular Exercise Tolerance: Good hypertension, (-) angina+ CAD  Normal cardiovascular exam     Neuro/Psych negative neurological ROS  negative psych ROS   GI/Hepatic Neg liver ROS, GERD  Controlled,  Endo/Other  negative endocrine ROS  Renal/GU      Musculoskeletal   Abdominal   Peds  Hematology negative hematology ROS (+)   Anesthesia Other Findings Past Medical History: No date: Acid reflux 08/06/2021: Adenocarcinoma of colon ([place])     Comment:  a.) CT on 07/10/2021 --> 20-25 cm from anal verge;               measured 3.7 x 2.7 cm.  b.) pathology (+) for invasive               moderately differentiated adenocarcinoma. No date: Aortic atherosclerosis ([place]) No date: Atherosclerosis of coronary artery     Comment:  a.) CT chest on 08/14/2021 --> moderate. No date: Atrial fibrillation and flutter ([name])     Comment:  a.) CHA2DS2VASc = 3 (age, HTN, aortic plaque). b.)               rate/rhythm maintained on oral diltiazem + flecanide;               chronically anticoagulated using apixaban. No date: Hypertension No date: Lesion of right lobe of liver     Comment:  a.) CT on 07/10/2021 --> 1.0 cm and 0.8 cm lesions in               anterior RIGHT lobe. No date: Long term current use of anticoagulant     Comment:  a.) apixaban No date: LVH (left ventricular hypertrophy)     Comment:  a.) TTE on 09/16/2017 - mild No date: Prostate cancer ([place])      Comment:  a.) Bx (+) for acinar adenocarcinoma No date: Valvular regurgitation     Comment:  a.) TTE on 09/23/2017 --> LVEF 55%; mild MV, TV, PV  Past Surgical History: 11/10/2017: CARDIOVERSION; N/A     Comment:  Procedure: CARDIOVERSION;  Surgeon: Teodoro Spray, MD;              Location: [place] [facility];  Service: Cardiovascular;                Laterality: N/A; 05/27/2018: CARDIOVERSION; N/A     Comment:  Procedure: CARDIOVERSION;  Surgeon: Teodoro Spray, MD;              Location: [name];  Service: Cardiovascular;                Laterality: N/A; 08/06/2021: COLONOSCOPY WITH PROPOFOL; N/A     Comment:  Procedure: COLONOSCOPY WITH PROPOFOL;  Surgeon: Robert Bellow, MD;  Location: [name];  Service:               Endoscopy;  Laterality: N/A; 06/06/2020: PROSTATE BIOPSY; N/A  Comment:  Procedure: PROSTATE BIOPSY URONAV;  Surgeon: Royston Cowper, MD;  Location: [name];  Service: Urology;                Laterality: N/A; 12/11/2021: PROSTATE BIOPSY; N/A     Comment:  Procedure: PROSTATE BIOPSY URONAV;  Surgeon: Royston Cowper, MD;  Location: [name];  Service: Urology;                Laterality: N/A;  BMI    Body Mass Index: 24.80 kg/m      Reproductive/Obstetrics negative OB ROS                             Anesthesia Physical  Anesthesia Plan  ASA: 3  Anesthesia Plan: General   Post-op Pain Management: Minimal or no pain anticipated   Induction: Intravenous  PONV Risk Score and Plan: 1 and Propofol infusion and TIVA  Airway Management Planned: Nasal Cannula and Natural Airway  Additional Equipment:   Intra-op Plan:   Post-operative Plan: Extubation in OR  Informed Consent: I have reviewed the patients History and Physical, chart, labs and discussed the procedure including the risks, benefits and alternatives for the proposed anesthesia with the patient or authorized representative who  has indicated his/her understanding and acceptance.     Dental Advisory Given  Plan Discussed with: Anesthesiologist, CRNA and Surgeon  Anesthesia Plan Comments: (Patient consented for risks of anesthesia including but not limited to:  - adverse reactions to medications - damage to eyes, teeth, lips or other oral mucosa - nerve damage due to positioning  - sore throat or hoarseness - Damage to heart, brain, nerves, lungs, other parts of body or loss of life  Patient voiced understanding.)        Anesthesia Quick Evaluation

## 2022-09-02 NOTE — Anesthesia Postprocedure Evaluation (Signed)
Anesthesia Post Note  Patient: Edwin [name]  Procedure(s) Performed: COLONOSCOPY WITH PROPOFOL  Patient location during evaluation: Endoscopy Anesthesia Type: General Level of consciousness: awake and alert Pain management: pain level controlled Vital Signs Assessment: post-procedure vital signs reviewed and stable Respiratory status: spontaneous breathing, nonlabored ventilation, respiratory function stable and patient connected to nasal cannula oxygen Cardiovascular status: blood pressure returned to baseline and stable Postop Assessment: no apparent nausea or vomiting Anesthetic complications: no   No notable events documented.   Last Vitals:  Vitals:   09/02/22 1001 09/02/22 1011  BP: 121/75 123/70  Pulse: (!) 54 (!) 47  Resp: 10 12  Temp:    SpO2: 100% 100%    Last Pain:  Vitals:   09/02/22 1011  TempSrc:   PainSc: 0-No pain                 Ilene Qua

## 2022-09-02 NOTE — H&P (Signed)
Edwin [name] [id number] 06-26-1951     HPI: 71 yo male one year s/p sigmoid colectomy for a T3,N0 carcinoma. For surveillance exam.   Medications Prior to Admission  Medication Sig Dispense Refill Last Dose   diltiazem (CARDIZEM CD) 120 MG 24 hr capsule Take 120 mg by mouth daily.   11 09/01/2022   doxazosin (CARDURA) 4 MG tablet Take 4 mg by mouth every evening.   09/01/2022   flecainide (TAMBOCOR) 100 MG tablet Take 100 mg by mouth 2 (two) times daily.   09/01/2022   losartan (COZAAR) 50 MG tablet Take 50 mg by mouth daily.    09/01/2022   metoprolol succinate (TOPROL-XL) 25 MG 24 hr tablet Take 25 mg by mouth at bedtime.    09/01/2022   calcium carbonate (TUMS - DOSED IN MG ELEMENTAL CALCIUM) 500 MG chewable tablet Chew 3-4 tablets by mouth daily.      docusate sodium (COLACE) 100 MG capsule Take 2 capsules (200 mg total) by mouth 2 (two) times daily. 120 capsule 3    ELIQUIS 5 MG TABS tablet Take 1 tablet (5 mg total) by mouth 2 (two) times daily. 60 tablet 11    Hyoscyamine Sulfate SL (LEVSIN/SL) 0.125 MG SUBL Place 0.125 mg under the tongue every 4 (four) hours as needed. 1-2 TABS 40 tablet 3    nitrofurantoin (MACRODANTIN) 100 MG capsule Take 1 capsule (100 mg total) by mouth 2 (two) times daily. 14 capsule 1    tamsulosin (FLOMAX) 0.4 MG CAPS capsule Take 1 capsule (0.4 mg total) by mouth daily. 30 capsule 11    No Known Allergies Past Medical History:  Diagnosis Date   Acid reflux    Adenocarcinoma of colon ([place]) 08/06/2021   a.) CT on 07/10/2021 --> 20-25 cm from anal verge; measured 3.7 x 2.7 cm.  b.) pathology (+) for invasive moderately differentiated adenocarcinoma.   Aortic atherosclerosis ([name])    Atherosclerosis of coronary artery    a.) CT chest on 08/14/2021 --> moderate.   Atrial fibrillation and flutter ([place])    a.) CHA2DS2VASc = 3 (age, HTN, aortic plaque). b.) rate/rhythm maintained on oral diltiazem + flecanide; chronically anticoagulated using apixaban.    Hypertension    Lesion of right lobe of liver    a.) CT on 07/10/2021 --> 1.0 cm and 0.8 cm lesions in anterior RIGHT lobe.   Long term current use of anticoagulant    a.) apixaban   LVH (left ventricular hypertrophy)    a.) TTE on 09/16/2017 - mild   Prostate cancer ([place])    a.) Bx (+) for acinar adenocarcinoma   Valvular regurgitation    a.) TTE on 09/23/2017 --> LVEF 55%; mild MV, TV, PV   Past Surgical History:  Procedure Laterality Date   CARDIOVERSION N/A 11/10/2017   Procedure: CARDIOVERSION;  Surgeon: Teodoro Spray, MD;  Location: [place] [facility];  Service: Cardiovascular;  Laterality: N/A;   CARDIOVERSION N/A 05/27/2018   Procedure: CARDIOVERSION;  Surgeon: Teodoro Spray, MD;  Location: [name];  Service: Cardiovascular;  Laterality: N/A;   COLONOSCOPY WITH PROPOFOL N/A 08/06/2021   Procedure: COLONOSCOPY WITH PROPOFOL;  Surgeon: Robert Bellow, MD;  Location: [place] [facility];  Service: Endoscopy;  Laterality: N/A;   HIGH INTENSITY FOCUSED ULTRASOUND (HIFU) OF THE PROSTATE  01/15/2022   Procedure: HIGH INTENSITY FOCUSED ULTRASOUND (HIFU) OF THE PROSTATE;  Surgeon: Royston Cowper, MD;  Location: [name];  Service: Urology;;   PROSTATE BIOPSY N/A 06/06/2020   Procedure: PROSTATE  BIOPSY URONAV;  Surgeon: Royston Cowper, MD;  Location: [name];  Service: Urology;  Laterality: N/A;   PROSTATE BIOPSY N/A 12/11/2021   Procedure: PROSTATE BIOPSY [name];  Surgeon: Royston Cowper, MD;  Location: [name];  Service: Urology;  Laterality: N/A;   Social History   Socioeconomic History   Marital status: Married    Spouse name: [name]   Number of children: Not on file   Years of education: Not on file   Highest education level: Not on file  Occupational History   Not on file  Tobacco Use   Smoking status: Never   Smokeless tobacco: Never  Vaping Use   Vaping Use: Never used  Substance and Sexual Activity   Alcohol use: Yes    Comment: approx 4 beers/ day   Drug use: No    Sexual activity: Not on file  Other Topics Concern   Not on file  Social History Narrative   Not on file   Social Determinants of Health   Financial Resource Strain: Not on file  Food Insecurity: Not on file  Transportation Needs: Not on file  Physical Activity: Not on file  Stress: Not on file  Social Connections: Not on file  Intimate Partner Violence: Not on file   Social History   Social History Narrative   Not on file     ROS: Negative.     PE: HEENT: Negative. Lungs: Clear. Cardio: RR.  Assessment/Plan:  Proceed with planned endoscopy.  Forest Gleason Ellenie Salome 09/02/2022

## 2022-09-02 NOTE — Transfer of Care (Signed)
Immediate Anesthesia Transfer of Care Note  Patient: Edwin [name]  Procedure(s) Performed: COLONOSCOPY WITH PROPOFOL  Patient Location: Endoscopy Unit  Anesthesia Type:MAC  Level of Consciousness: drowsy  Airway & Oxygen Therapy: Patient Spontanous Breathing  Post-op Assessment: Report given to RN and Post -op Vital signs reviewed and stable  Post vital signs: Reviewed and stable  Last Vitals:  Vitals Value Taken Time  BP 95/52 09/02/22 0952  Temp 35.9 C 09/02/22 0951  Pulse 54 09/02/22 0953  Resp 13 09/02/22 0953  SpO2 100 % 09/02/22 0953  Vitals shown include unvalidated device data.  Last Pain:  Vitals:   09/02/22 0951  TempSrc: Tympanic  PainSc: Asleep         Complications: No notable events documented.

## 2022-09-03 ENCOUNTER — Encounter: Payer: Self-pay | Admitting: General Surgery

## 2022-09-03 LAB — SURGICAL PATHOLOGY

## 2022-12-03 ENCOUNTER — Encounter: Payer: Self-pay | Admitting: Urology

## 2022-12-03 ENCOUNTER — Ambulatory Visit (INDEPENDENT_AMBULATORY_CARE_PROVIDER_SITE_OTHER): Payer: Medicare Other | Admitting: Urology

## 2022-12-03 VITALS — BP 159/93 | HR 56 | Ht 73.0 in | Wt 190.0 lb

## 2022-12-03 DIAGNOSIS — R399 Unspecified symptoms and signs involving the genitourinary system: Secondary | ICD-10-CM | POA: Diagnosis not present

## 2022-12-03 DIAGNOSIS — C61 Malignant neoplasm of prostate: Secondary | ICD-10-CM | POA: Diagnosis not present

## 2022-12-03 DIAGNOSIS — N529 Male erectile dysfunction, unspecified: Secondary | ICD-10-CM

## 2022-12-03 DIAGNOSIS — Z125 Encounter for screening for malignant neoplasm of prostate: Secondary | ICD-10-CM

## 2022-12-03 NOTE — Progress Notes (Signed)
12/03/22 12:04 PM   Edwin [name] 10-14-1951 [id number]  CC: Prostate cancer treated with HIFU, ED  HPI: 72 year old male with history of A-fib on Eliquis who is referred to establish care after his primary urologist Dr. Yves [name] retired.  He was originally diagnosed with low risk prostate cancer in 2021 with a baseline PSA 5.7, PSA increased to 6.8 in July 2022 and MRI showed two PI-RADS 4 lesions on the left side.  Repeat fusion guided biopsy showed increased volume of low risk disease in addition to new 3+4=7 disease in the ROI.  He opted for HIFU, and this was completed on 01/15/2022 by Dr. Yves [name].  The post-op records are not available to me.  He reports his PSA was 2.7 in July 2023, followed by 3.0 in October 2023.  He reports mild urinary symptoms of some frequency and weak stream, previously was on Flomax but discontinued that medication.  His symptoms are not bothersome enough to consider medications at this time.  He also reports worsening problems with erectile dysfunction over the last year, but is not interested in medications at this time.    PMH: Past Medical History:  Diagnosis Date   Acid reflux    Adenocarcinoma of colon ([name]) 08/06/2021   a.) CT on 07/10/2021 --> 20-25 cm from anal verge; measured 3.7 x 2.7 cm.  b.) pathology (+) for invasive moderately differentiated adenocarcinoma.   Aortic atherosclerosis ([name])    Atherosclerosis of coronary artery    a.) CT chest on 08/14/2021 --> moderate.   Atrial fibrillation and flutter ([name])    a.) CHA2DS2VASc = 3 (age, HTN, aortic plaque). b.) rate/rhythm maintained on oral diltiazem + flecanide; chronically anticoagulated using apixaban.   Hypertension    Lesion of right lobe of liver    a.) CT on 07/10/2021 --> 1.0 cm and 0.8 cm lesions in anterior RIGHT lobe.   Long term current use of anticoagulant    a.) apixaban   LVH (left ventricular hypertrophy)    a.) TTE on 09/16/2017 - mild   Prostate cancer ([name])    a.) Bx  (+) for acinar adenocarcinoma   Valvular regurgitation    a.) TTE on 09/23/2017 --> LVEF 55%; mild MV, TV, PV    Surgical History: Past Surgical History:  Procedure Laterality Date   CARDIOVERSION N/A 11/10/2017   Procedure: CARDIOVERSION;  Surgeon: Edwin Spray, MD;  Location: [name];  Service: Cardiovascular;  Laterality: N/A;   CARDIOVERSION N/A 05/27/2018   Procedure: CARDIOVERSION;  Surgeon: Edwin Spray, MD;  Location: [name];  Service: Cardiovascular;  Laterality: N/A;   COLONOSCOPY WITH PROPOFOL N/A 08/06/2021   Procedure: COLONOSCOPY WITH PROPOFOL;  Surgeon: Edwin Bellow, MD;  Location: [name];  Service: Endoscopy;  Laterality: N/A;   COLONOSCOPY WITH PROPOFOL N/A 09/02/2022   Procedure: COLONOSCOPY WITH PROPOFOL;  Surgeon: Edwin Bellow, MD;  Location: [name];  Service: Endoscopy;  Laterality: N/A;   HIGH INTENSITY FOCUSED ULTRASOUND (HIFU) OF THE PROSTATE  01/15/2022   Procedure: HIGH INTENSITY FOCUSED ULTRASOUND (HIFU) OF THE PROSTATE;  Surgeon: Edwin Cowper, MD;  Location: [name];  Service: Urology;;   PROSTATE BIOPSY N/A 06/06/2020   Procedure: PROSTATE BIOPSY Edwin [name];  Surgeon: Edwin Cowper, MD;  Location: [name];  Service: Urology;  Laterality: N/A;   PROSTATE BIOPSY N/A 12/11/2021   Procedure: PROSTATE BIOPSY Edwin [name];  Surgeon: Edwin Cowper, MD;  Location: [name];  Service: Urology;  Laterality: N/A;    Family History:  Family History  Problem Relation Age of Onset   Dementia Mother    Sudden death Father    Heart disease Brother     Social History:  reports that he has never smoked. He has never used smokeless tobacco. He reports current alcohol use. He reports that he does not use drugs.  Physical Exam: BP (!) 159/93   Pulse (!) 56   Ht '6\' 1"'$  (1.854 m)   Wt 190 lb (86.2 kg)   BMI 25.07 kg/m    Constitutional:  Alert and oriented, No acute distress. Cardiovascular: No clubbing, cyanosis, or edema. Respiratory:  Normal respiratory effort, no increased work of breathing. GI: Abdomen is soft, nontender, nondistended, no abdominal masses  Laboratory Data: Reviewed, see HPI  Pertinent Imaging: I have personally viewed and interpreted the prostate MRI from August 2022 showing PI-RADS 4 lesions x 2 in the left prostate, prostate volume 30 g.  Assessment & Plan:   72 year old male previously followed by Dr. Liana [name] reviewed the operative note, clinic notes not available), underwent HIFU in February 2023 for favorable intermediate risk disease with pretreatment PSA of 6.8.  PSA was then 2.7 in July 2023, and 3.0 in October 2023.  We discussed the risk of recurrence at length, and the need to continue to closely monitor the PSA trend.  He has mild urinary symptoms and not interested in medications at this time.  Also has ED but not interested in medications.  Risk and benefits of Cialis discussed extensively, okay to trial Cialis or sildenafil in the future if patient desires.  -PSA today, call with results -If PSA stable, RTC for PSA in 6 months.  If PSA rising significantly may need to consider repeat prostate MRI or repeat biopsy to evaluate for incompletely treated disease  Edwin Madrid, MD 12/03/2022  [facility] [address] [place], [place] [address] [phone]

## 2022-12-03 NOTE — Patient Instructions (Signed)
Tadalafil Tablets (Erectile Dysfunction, BPH) What is this medication? TADALAFIL (tah DA la fil) treats erectile dysfunction (ED). It works by increasing blood flow to the penis, which helps to maintain an erection. It may also be used to treat symptoms of an enlarged prostate (benign prostatic hyperplasia). This medicine may be used for other purposes; ask your health care provider or pharmacist if you have questions. COMMON BRAND NAME(S): Kathaleen Bury, Cialis What should I tell my care team before I take this medication? They need to know if you have any of these conditions: Anatomical deformation of the penis, Peyronie's disease, or history of priapism (painful and prolonged erection) Bleeding disorders Eye or vision problems, including a rare inherited eye disease called retinitis pigmentosa Heart disease, angina, a history of heart attack, irregular heart beats, or other heart problems High or low blood pressure History of blood diseases, like sickle cell anemia or leukemia History of stomach bleeding Kidney disease Liver disease Stroke An unusual or allergic reaction to tadalafil, other medications, foods, dyes, or preservatives Pregnant or trying to get pregnant Breast-feeding How should I use this medication? Take this medication by mouth with a glass of water. Follow the directions on the prescription label. You may take this medication with or without meals. When this medication is used for erection problems, your care team may prescribe it to be taken once daily or as needed. If you are taking the medication as needed, you may be able to have sexual activity 30 minutes after taking it and for up to 36 hours after taking it. Whether you are taking the medication as needed or once daily, you should not take more than one dose per day. If you are taking this medication for symptoms of benign prostatic hyperplasia (BPH) or to treat both BPH and an erection problem, take the dose once  daily at about the same time each day. Do not take your medication more often than directed. Talk to your care team about the use of this medication in children. Special care may be needed. Overdosage: If you think you have taken too much of this medicine contact a poison control center or emergency room at once. NOTE: This medicine is only for you. Do not share this medicine with others. What if I miss a dose? If you are taking this medication as needed for erection problems, this does not apply. If you miss a dose while taking this medication once daily for an erection problem, benign prostatic hyperplasia, or both, take it as soon as you remember, but do not take more than one dose per day. What may interact with this medication? Do not take this medication with any of the following: Nitrates like amyl nitrite, isosorbide dinitrate, isosorbide mononitrate, nitroglycerin Other medications for erectile dysfunction like avanafil, sildenafil, vardenafil Other tadalafil products (Adcirca) Riociguat This medication may also interact with the following: Certain medications for high blood pressure Certain medications for the treatment of HIV infection or AIDS Certain medications used for fungal or yeast infections, like fluconazole, itraconazole, ketoconazole, and voriconazole Certain medications used for seizures like carbamazepine, phenytoin, and phenobarbital Grapefruit juice Macrolide antibiotics like clarithromycin, erythromycin, troleandomycin Medications for prostate problems Rifabutin, rifampin or rifapentine This list may not describe all possible interactions. Give your health care provider a list of all the medicines, herbs, non-prescription drugs, or dietary supplements you use. Also tell them if you smoke, drink alcohol, or use illegal drugs. Some items may interact with your medicine. What should I watch for  while using this medication? If you notice any changes in your vision while  taking this medication, call your care team as soon as possible. Stop using this medication and call your care team right away if you have a loss of sight in one or both eyes. Contact your care team right away if the erection lasts longer than 4 hours or if it becomes painful. This may be a sign of serious problem and must be treated right away to prevent permanent damage. If you experience symptoms of nausea, dizziness, chest pain or arm pain upon initiation of sexual activity after taking this medication, you should refrain from further activity and call your care team as soon as possible. Do not drink alcohol to excess (examples, 5 glasses of wine or 5 shots of whiskey) when taking this medication. When taken in excess, alcohol can increase your chances of getting a headache or getting dizzy, increasing your heart rate or lowering your blood pressure. Using this medication does not protect you or your partner against HIV infection (the virus that causes AIDS) or other sexually transmitted diseases. What side effects may I notice from receiving this medication? Side effects that you should report to your care team as soon as possible: Allergic reactions--skin rash, itching, hives, swelling of the face, lips, tongue, or throat Hearing loss or ringing in ears Heart attack--pain or tightness in the chest, shoulders, arms, or jaw, nausea, shortness of breath, cold or clammy skin, feeling faint or lightheaded Low blood pressure--dizziness, feeling faint or lightheaded, blurry vision Prolonged or painful erection Redness, blistering, peeling, or loosening of the skin, including inside the mouth Stroke--sudden numbness or weakness of the face, arm, or leg, trouble speaking, confusion, trouble walking, loss of balance or coordination, dizziness, severe headache, change in vision Sudden vision loss in one or both eyes Side effects that usually do not require medical attention (report to your care team if  they continue or are bothersome): Back pain Facial flushing or redness Headache Muscle pain Runny or stuffy nose Upset stomach This list may not describe all possible side effects. Call your doctor for medical advice about side effects. You may report side effects to FDA at 1-800-FDA-1088. Where should I keep my medication? Keep out of the reach of children. Store at room temperature between 15 and 30 degrees C (59 and 86 degrees F). Throw away any unused medication after the expiration date. NOTE: This sheet is a summary. It may not cover all possible information. If you have questions about this medicine, talk to your doctor, pharmacist, or health care provider.  2023 Elsevier/Gold Standard (2021-01-24 00:00:00)

## 2022-12-04 ENCOUNTER — Other Ambulatory Visit: Payer: Self-pay

## 2022-12-04 DIAGNOSIS — Z125 Encounter for screening for malignant neoplasm of prostate: Secondary | ICD-10-CM

## 2022-12-04 DIAGNOSIS — C61 Malignant neoplasm of prostate: Secondary | ICD-10-CM

## 2022-12-04 LAB — PSA: Prostate Specific Ag, Serum: 2.8 ng/mL (ref 0.0–4.0)

## 2023-03-16 IMAGING — CT CT ABD-PELV W/ CM
2 of 5 series · 16 of 46 positions shown, 18 images · IV contrast (omnipaque)
Comparison: MR prostate, 07/02/2021

CLINICAL DATA: Colon mass incidentally identified by prior MR of
the prostate

EXAM:
CT ABDOMEN AND PELVIS WITH CONTRAST
TECHNIQUE: Multidetector CT imaging of the abdomen and pelvis was performed
using the standard protocol following bolus administration of
intravenous contrast.
CONTRAST:  100mL OMNIPAQUE IOHEXOL 350 MG/ML SOLN, additional oral
enteric contrast

[Series 2: abd pelvis 5.00 · axial · 0.76mm/px · z∈[-1590,-1190]mm · 13 of 92 slices shown, 15 images]
[im 6/92  soft-tissue]
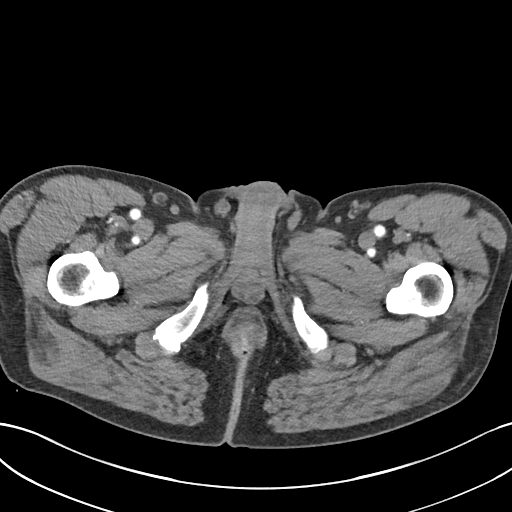
[im 6/92  bone]
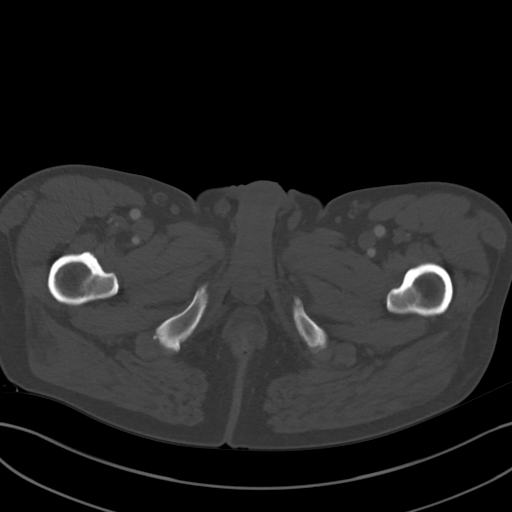
[im 11/92  soft-tissue]
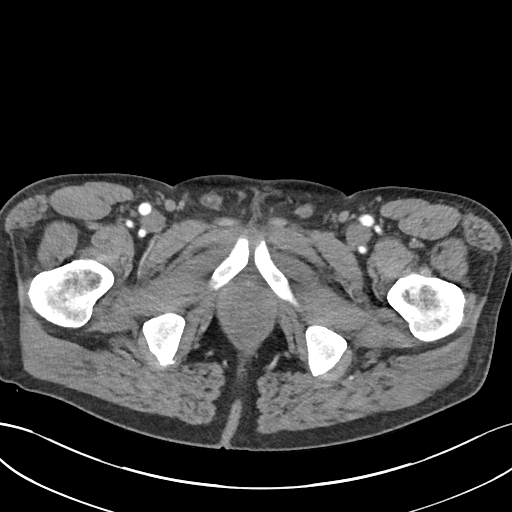
[im 21/92  soft-tissue]
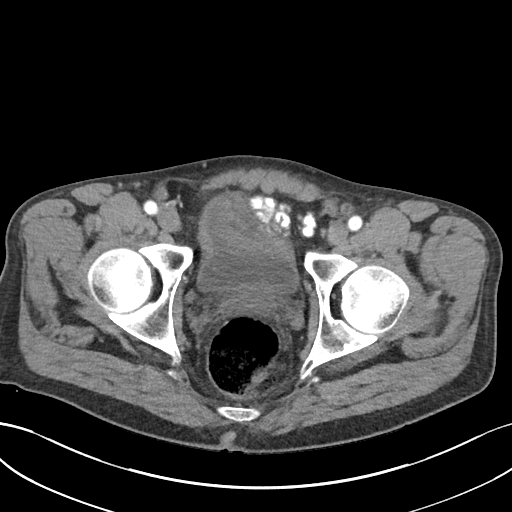
[im 26/92  soft-tissue]
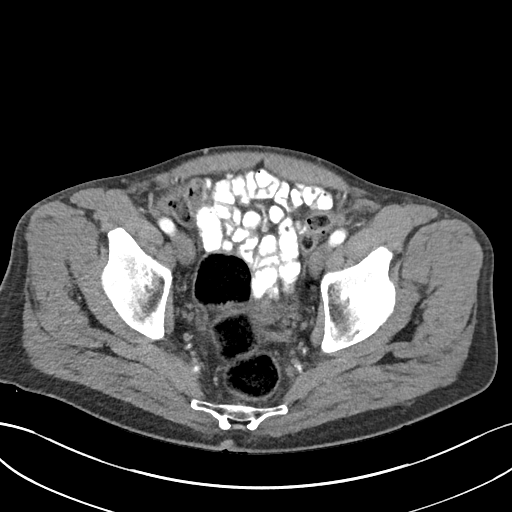
[im 31/92  soft-tissue]
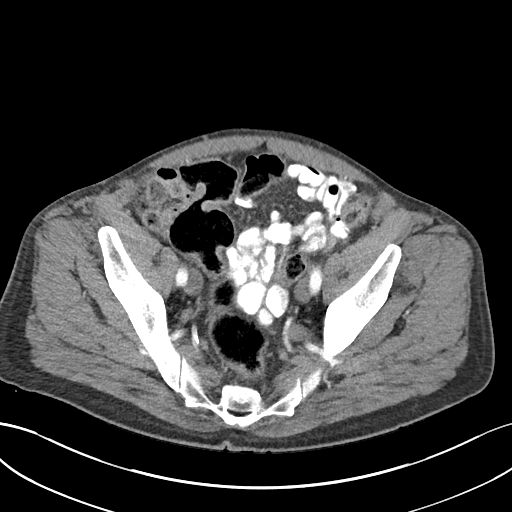
[im 41/92  soft-tissue]
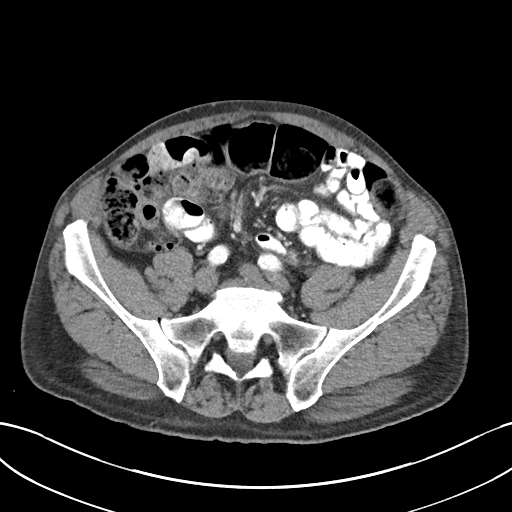
[im 46/92  soft-tissue]
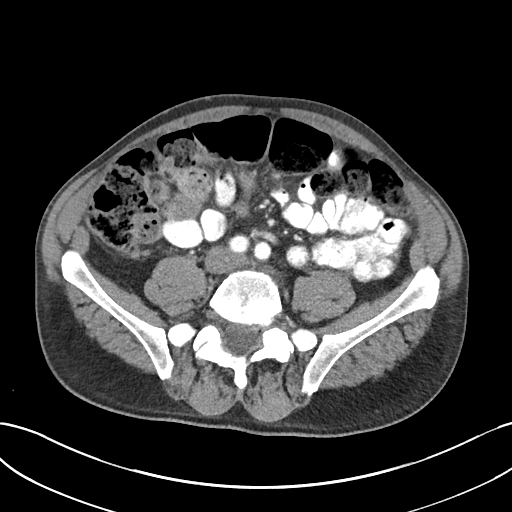
[im 51/92  soft-tissue]
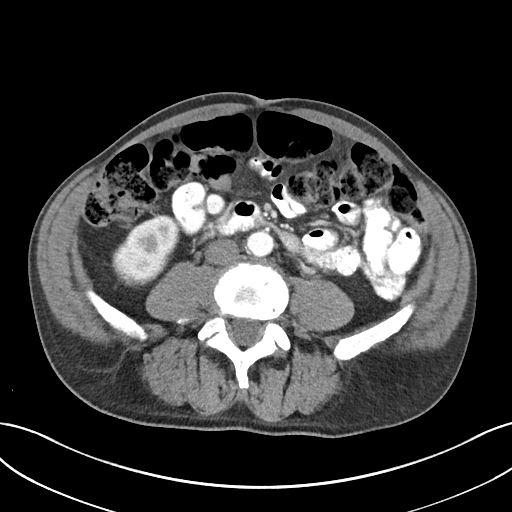
[im 61/92  soft-tissue]
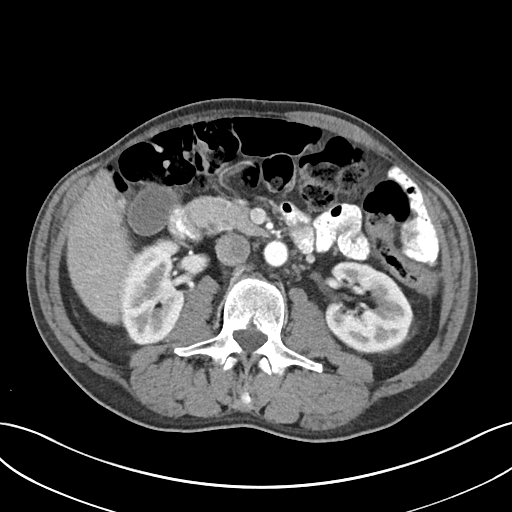
[im 61/92  bone]
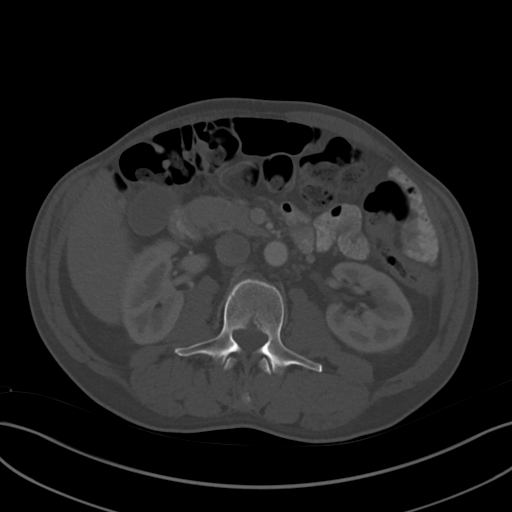
[im 66/92  soft-tissue]
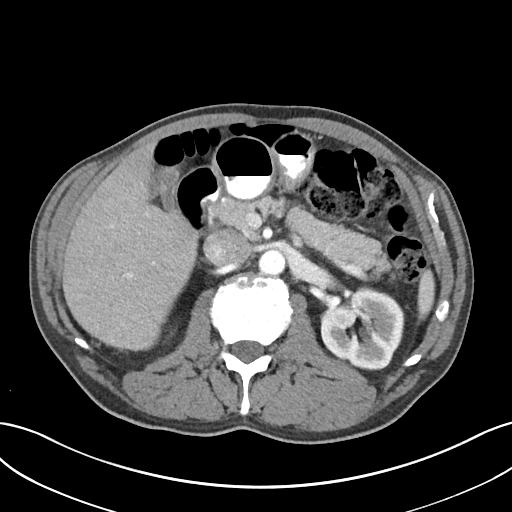
[im 71/92  soft-tissue]
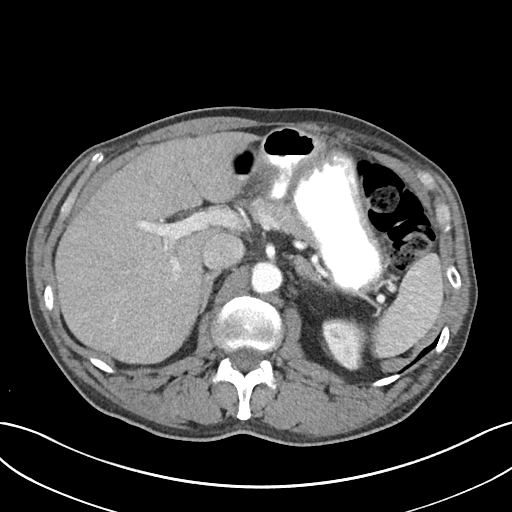
[im 81/92  soft-tissue]
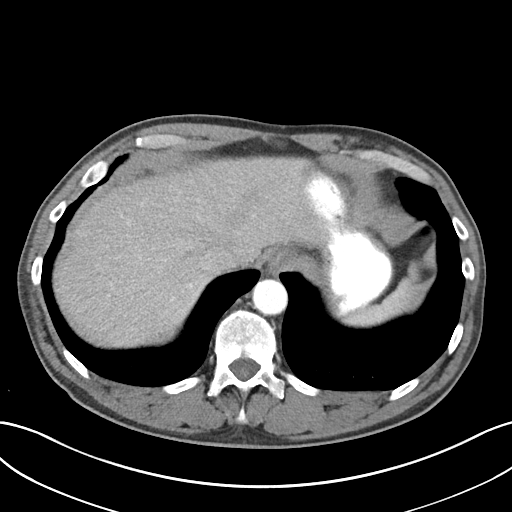
[im 86/92  soft-tissue]
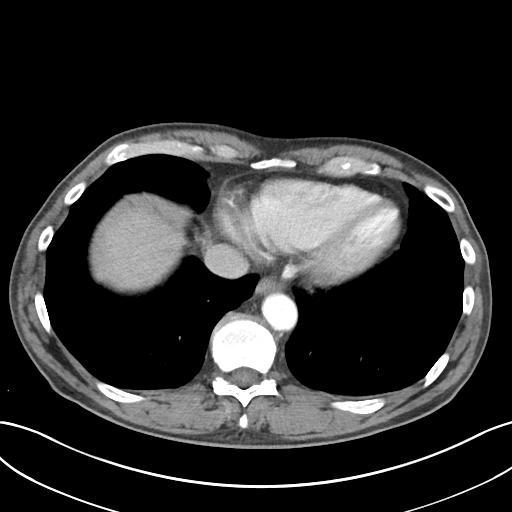

[Series 4: coronals abd pelvis 2.00 cor · coronal · 0.76mm/px · 3 of 140 slices shown]
[im 47/140  soft-tissue]
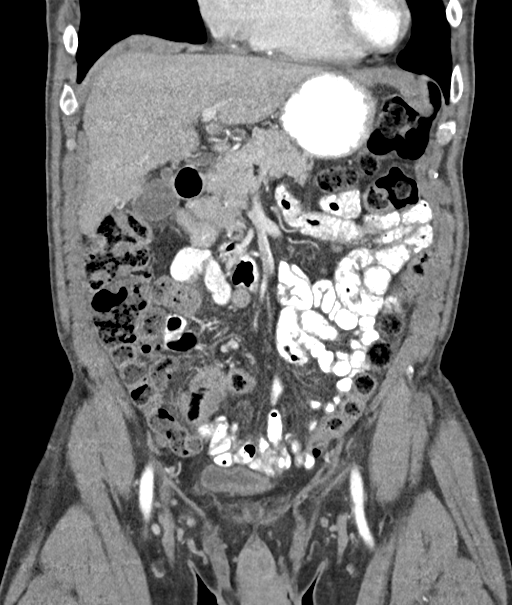
[im 62/140  soft-tissue]
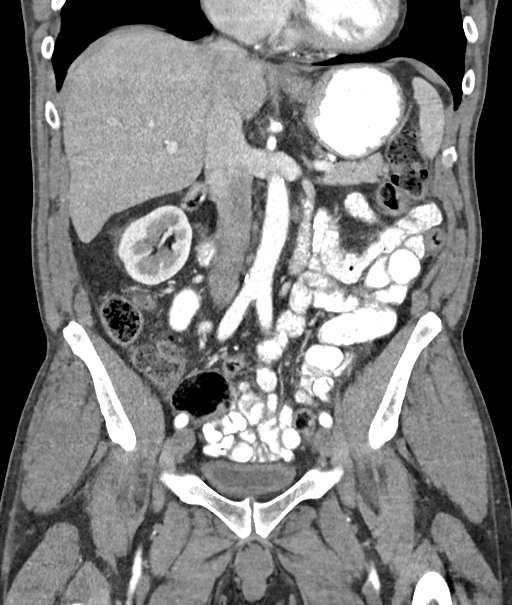
[im 78/140  soft-tissue]
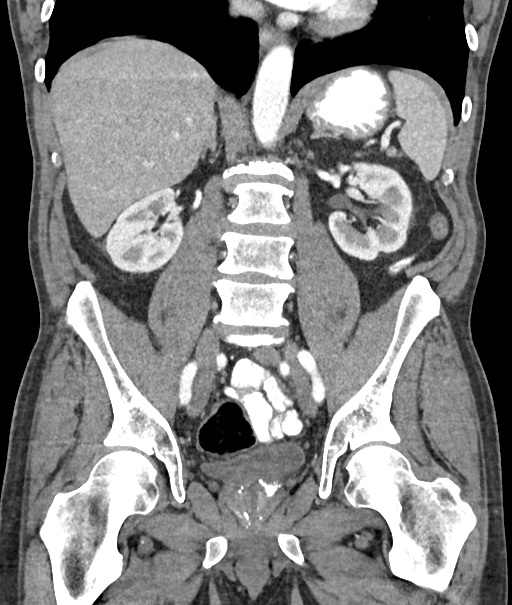

[16 of 46 positions shown; findings below may reference images not displayed]

FINDINGS: Lower chest: No acute abnormality.

Hepatobiliary: Small subcapsular enhancing lesion of the anterior
right lobe of the liver measuring 1.0 cm (series 2, image 14).
cm enhancing lesion of the inferior right lobe of the liver, hepatic
segment VI (series 2, image 28). No gallstones, gallbladder wall
thickening, or biliary dilatation.

Pancreas: Unremarkable. No pancreatic ductal dilatation or
surrounding inflammatory changes.

Spleen: Normal in size without significant abnormality.

Adrenals/Urinary Tract: Adrenal glands are unremarkable. Kidneys are
normal, without renal calculi, solid lesion, or hydronephrosis.
Bladder is unremarkable.

Stomach/Bowel: Stomach is within normal limits. Appendix appears
normal. Large burden of stool throughout the colon. The colon is
very redundant, particularly the sigmoid colon, and there is a
fungating endoluminal mass at the left aspect of the sigmoid colon,
approximately 20-25 cm from the anal verge, measuring at least 3.7 x
2.7 cm (series 2, image 59, series 6, image 83).

Vascular/Lymphatic: Scattered aortic atherosclerosis. No enlarged
abdominal or pelvic lymph nodes.

Reproductive: No mass or other significant abnormality.

Other: No abdominal wall hernia or abnormality. No abdominopelvic
ascites.

Musculoskeletal: No acute or significant osseous findings.
IMPRESSION: 1. The colon is very redundant, particularly the sigmoid colon, and
there is a fungating endoluminal mass at the left aspect of the
sigmoid colon, approximately 20-25 cm from the anal verge, measuring
at least 3.7 x 2.7 cm. Findings are consistent with primary colon
malignancy.
2. No definite evidence of lymphadenopathy or metastatic disease in
the abdomen or pelvis.
3. There are two small enhancing lesions of the anterior right lobe
of the liver measuring 1.0 cm and 0.8 cm. These most likely reflect
small flash filling hemangiomata, hepatic metastases not favored
although not strictly excluded. Due to small size, these may be
technically difficult to characterize by multiphasic MR. Attention
on follow-up.

Aortic Atherosclerosis ([id number]-VOI.I).

## 2023-04-20 IMAGING — CT CT CHEST W/ CM
2 of 3 series · 15 of 36 positions shown, 18 images · IV contrast (omnipaque)
Comparison: 07/10/2021

CLINICAL DATA: Malignant neoplasm of sigmoid colon

EXAM:
CT CHEST WITH CONTRAST
TECHNIQUE: Multidetector CT imaging of the chest was performed during
intravenous contrast administration.
CONTRAST:  100mL OMNIPAQUE IOHEXOL 350 MG/ML SOLN

[Series 2: axial st · axial · 0.87mm/px · z∈[-687,-371]mm · 12 of 186 slices shown, 15 images]
[im 14/186  mediastinal]
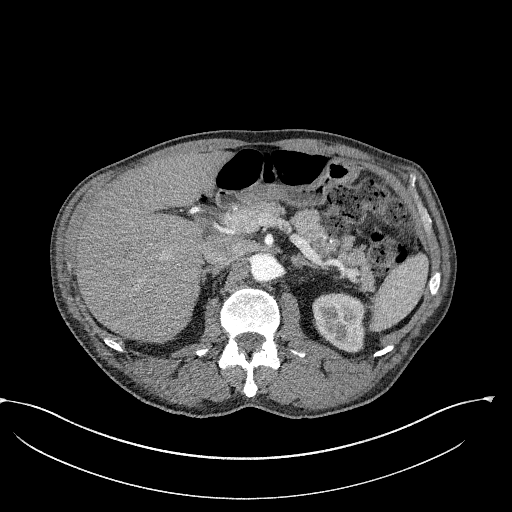
[im 14/186  lung]
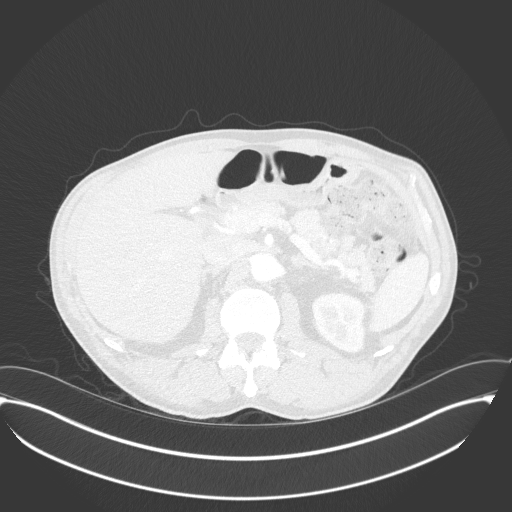
[im 28/186  lung]
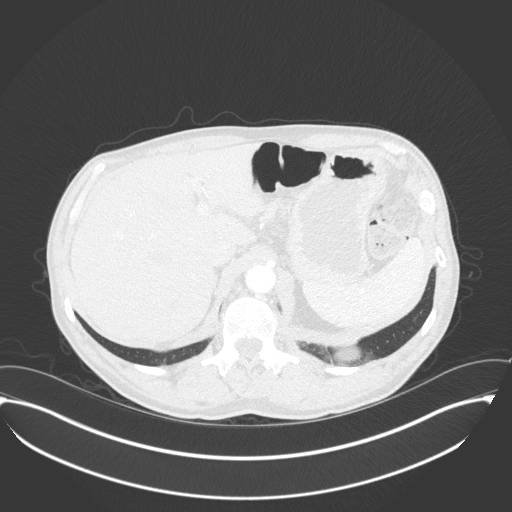
[im 42/186  lung]
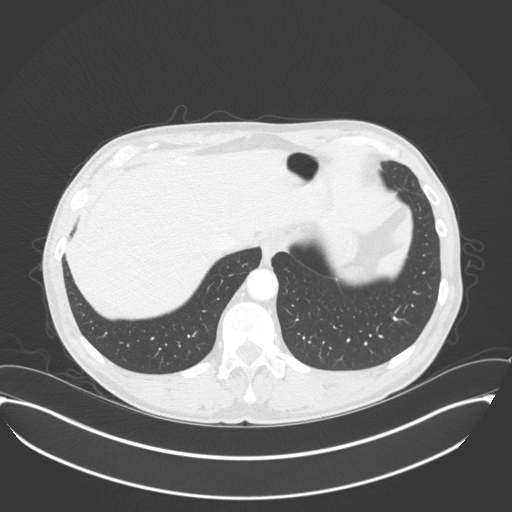
[im 55/186  lung]
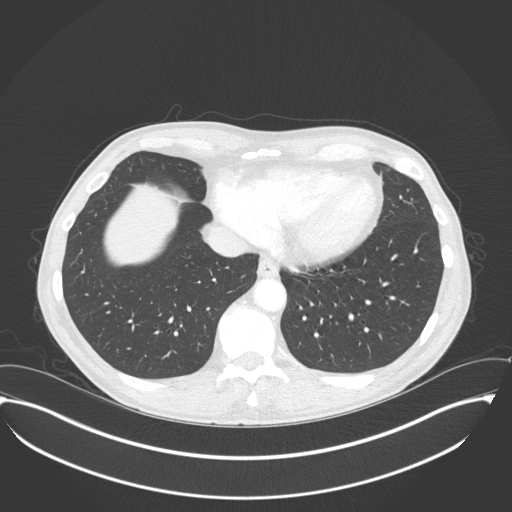
[im 69/186  mediastinal]
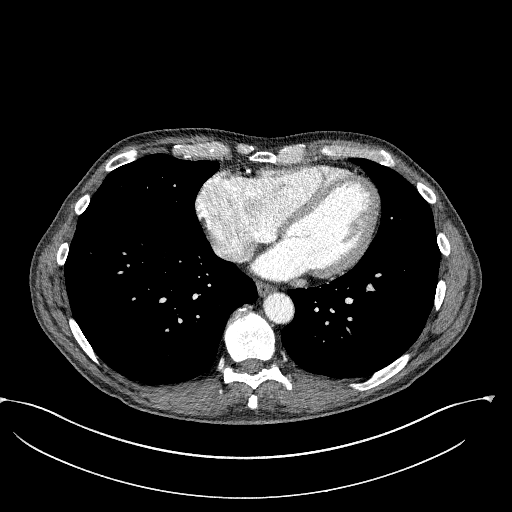
[im 69/186  lung]
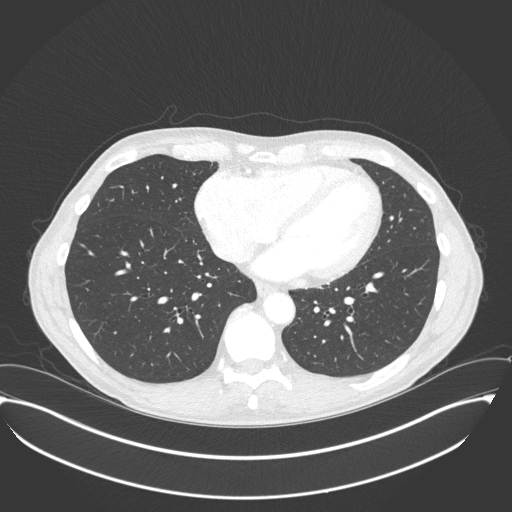
[im 83/186  lung]
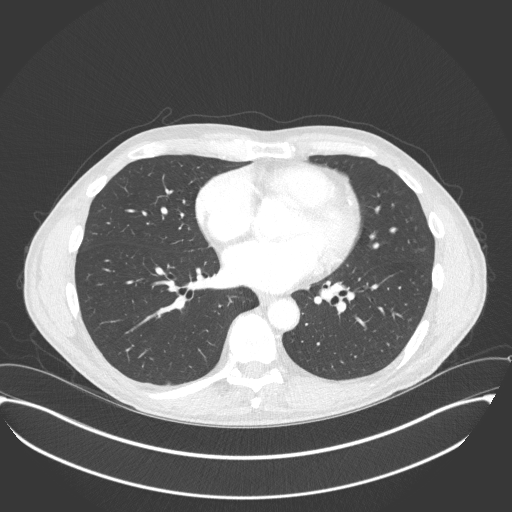
[im 103/186  lung]
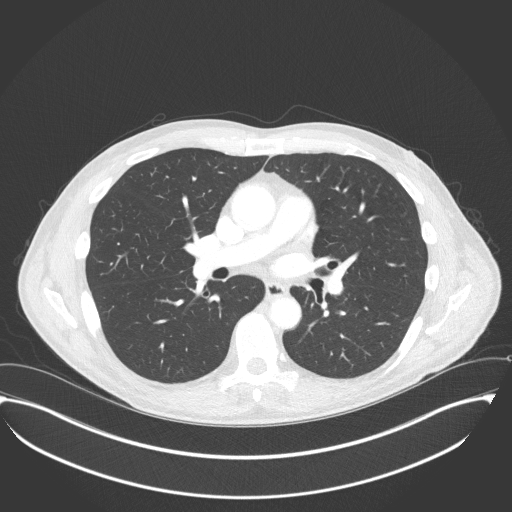
[im 117/186  lung]
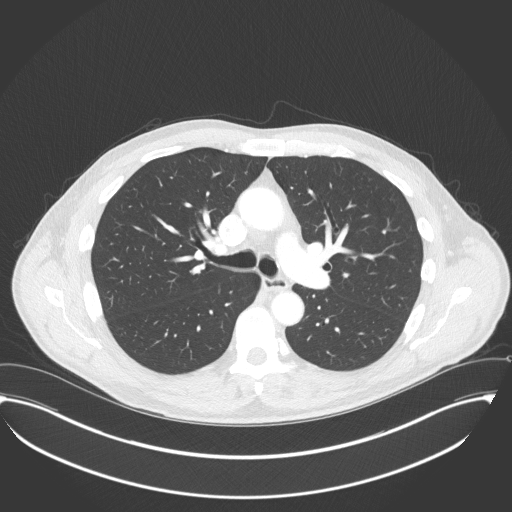
[im 131/186  mediastinal]
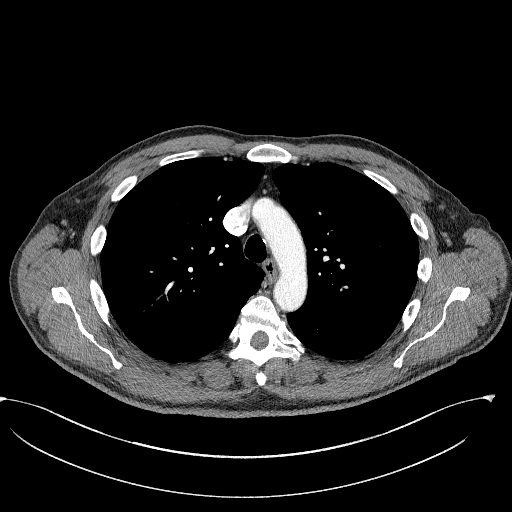
[im 131/186  lung]
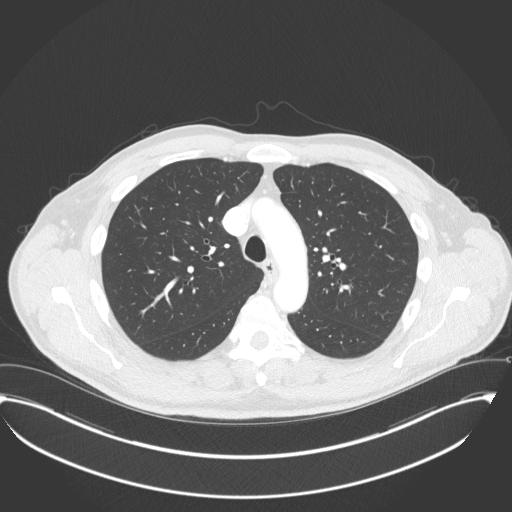
[im 144/186  lung]
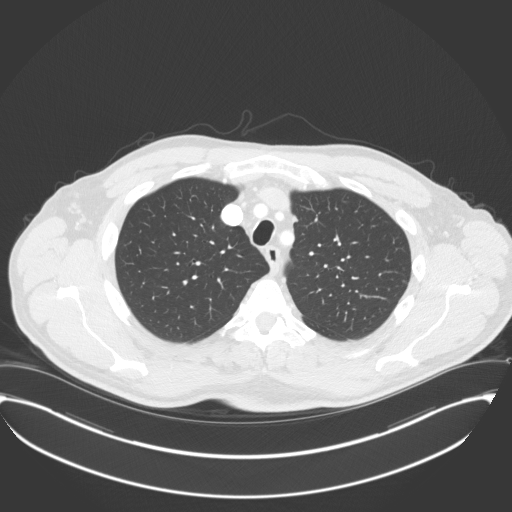
[im 158/186  lung]
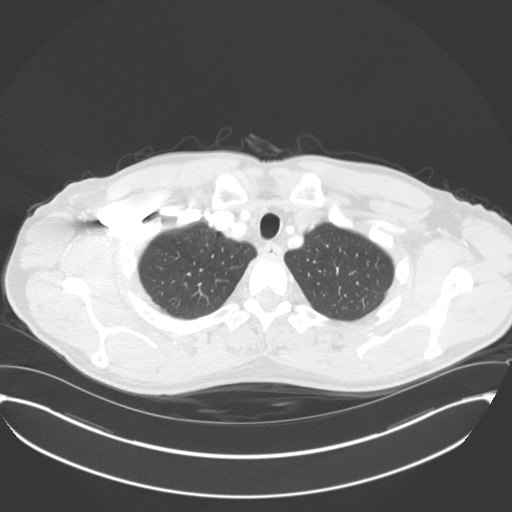
[im 172/186  lung]
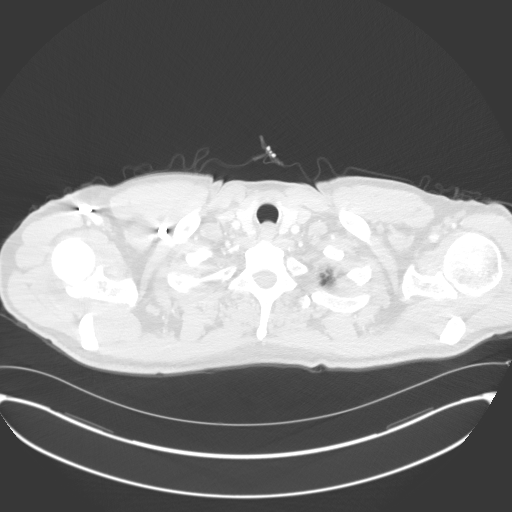

[Series 5: coronal · coronal · 0.78mm/px · 3 of 134 slices shown]
[im 27/134  lung]
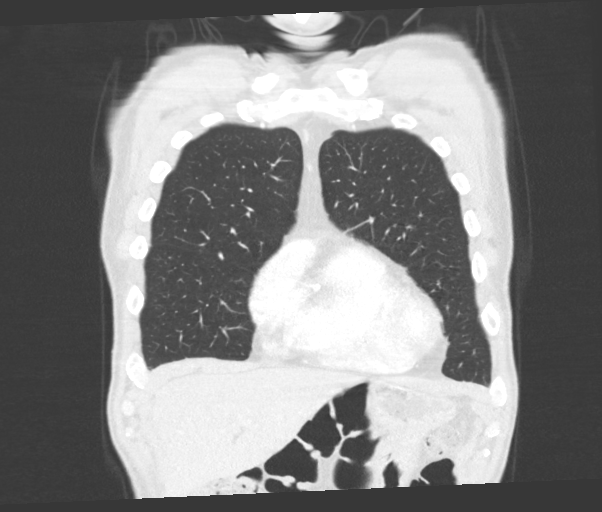
[im 54/134  lung]
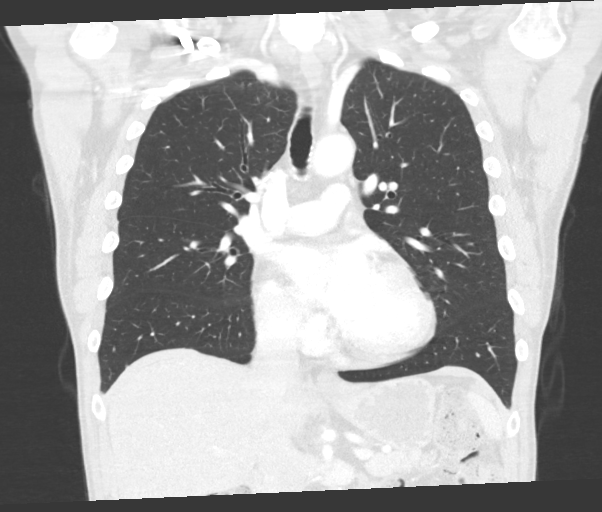
[im 80/134  lung]
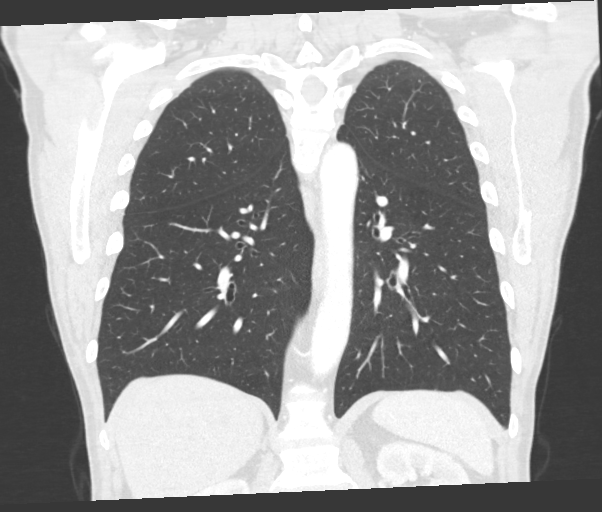

[15 of 36 positions shown; findings below may reference images not displayed]

FINDINGS: Cardiovascular: The heart and great vessels are unremarkable without
pericardial effusion. No evidence of thoracic aortic aneurysm or
dissection. Moderate atherosclerosis of the coronary vasculature.

Mediastinum/Nodes: No enlarged mediastinal, hilar, or axillary lymph
nodes. Thyroid gland, trachea, and esophagus demonstrate no
significant findings.

Lungs/Pleura: No acute airspace disease, effusion, or pneumothorax.
Central airways are widely patent.

Upper Abdomen: The hyperattenuating right lobe liver lesions seen
previously are again identified and are not appreciably changed.
Right lobe subcapsular lesion image 148/2 measures 1.1 cm, and
inferior right lobe lesion measuring 0.7 cm on image 181/2 are
unchanged since prior exam. No new liver lesions are identified.
Remainder of the upper abdomen is unremarkable.

Musculoskeletal: No acute or destructive bony lesions. Reconstructed
images demonstrate no additional findings.
IMPRESSION: 1. No evidence of intrathoracic metastases.
2. No acute intrathoracic process.
3. Stable indeterminate liver lesions, metastatic disease not
excluded.
4. Moderate atherosclerosis of the coronary arteries.

## 2023-04-30 ENCOUNTER — Other Ambulatory Visit: Payer: Medicare Other

## 2023-04-30 DIAGNOSIS — Z125 Encounter for screening for malignant neoplasm of prostate: Secondary | ICD-10-CM

## 2023-04-30 DIAGNOSIS — C61 Malignant neoplasm of prostate: Secondary | ICD-10-CM

## 2023-05-01 LAB — PSA: Prostate Specific Ag, Serum: 3.1 ng/mL (ref 0.0–4.0)

## 2023-05-06 ENCOUNTER — Ambulatory Visit (INDEPENDENT_AMBULATORY_CARE_PROVIDER_SITE_OTHER): Payer: Medicare Other | Admitting: Urology

## 2023-05-06 ENCOUNTER — Encounter: Payer: Self-pay | Admitting: Urology

## 2023-05-06 VITALS — BP 153/83 | HR 59 | Ht 73.0 in | Wt 190.0 lb

## 2023-05-06 DIAGNOSIS — Z08 Encounter for follow-up examination after completed treatment for malignant neoplasm: Secondary | ICD-10-CM | POA: Diagnosis not present

## 2023-05-06 DIAGNOSIS — Z8546 Personal history of malignant neoplasm of prostate: Secondary | ICD-10-CM

## 2023-05-06 DIAGNOSIS — C61 Malignant neoplasm of prostate: Secondary | ICD-10-CM

## 2023-05-06 NOTE — Addendum Note (Signed)
Addended by: Debbe Bales C on: 05/06/2023 03:00 PM   Modules accepted: Orders

## 2023-05-06 NOTE — Progress Notes (Signed)
   05/06/2023 2:56 PM   Edwin [name] 1951-07-29 [id number]  Reason for visit: Follow up prostate cancer treated with HIFU, ED  HPI: Prior patient of Dr. Sheppard Penton, transferred care to [place] in January 2024 after his retirement.He was originally diagnosed with low risk prostate cancer in 2021 with a baseline PSA 5.7, PSA increased to 6.8 in July 2022 and MRI showed two PI-RADS 4 lesions on the left side.  Repeat fusion guided biopsy showed increased volume of low risk disease in addition to new 3+4=7 disease in the ROI.  He opted for HIFU, and this was completed on 01/15/2022 by Dr. Evelene Croon.   PSA was 2.7 in July 2023, 3.0 in October 2023, 2.8 in January 2024, and most recently 3.1 in May 2024.  We reviewed the challenges of interpreting PSA trend after HIFU, and importance of monitoring the PSA closely, with consideration of prostate MRI or repeat biopsy if PSA starts to rise.  He has mild urinary symptoms of frequency and weak stream, not bothersome enough to consider medications.  Also has mild ED, also not interested in medications at this time.  6 months PSA lab visit, call with results, if stable RTC 1 year with PSA prior   Sondra Come, MD  [facility] [address] [place], [place] [address] [phone]

## 2023-11-05 ENCOUNTER — Other Ambulatory Visit: Payer: Medicare Other

## 2023-11-05 DIAGNOSIS — C61 Malignant neoplasm of prostate: Secondary | ICD-10-CM

## 2023-11-06 LAB — PSA: Prostate Specific Ag, Serum: 3.8 ng/mL (ref 0.0–4.0)

## 2023-11-10 DIAGNOSIS — C61 Malignant neoplasm of prostate: Secondary | ICD-10-CM

## 2023-11-10 NOTE — Telephone Encounter (Signed)
 PSA ordered. Appt scheduled.

## 2024-04-27 ENCOUNTER — Encounter: Payer: Self-pay | Admitting: Urology

## 2024-05-05 ENCOUNTER — Other Ambulatory Visit: Payer: Medicare Other

## 2024-05-11 ENCOUNTER — Ambulatory Visit: Payer: Medicare Other | Admitting: Urology

## 2024-05-15 ENCOUNTER — Other Ambulatory Visit: Payer: Self-pay

## 2024-05-15 DIAGNOSIS — C61 Malignant neoplasm of prostate: Secondary | ICD-10-CM

## 2024-05-16 LAB — PSA: Prostate Specific Ag, Serum: 4 ng/mL (ref 0.0–4.0)

## 2024-05-25 ENCOUNTER — Ambulatory Visit: Payer: Self-pay | Admitting: Urology

## 2024-06-08 ENCOUNTER — Ambulatory Visit (INDEPENDENT_AMBULATORY_CARE_PROVIDER_SITE_OTHER): Admitting: Urology

## 2024-06-08 VITALS — BP 154/91 | HR 56 | Ht 72.0 in | Wt 192.0 lb

## 2024-06-08 DIAGNOSIS — Z125 Encounter for screening for malignant neoplasm of prostate: Secondary | ICD-10-CM

## 2024-06-08 DIAGNOSIS — R399 Unspecified symptoms and signs involving the genitourinary system: Secondary | ICD-10-CM

## 2024-06-08 DIAGNOSIS — C61 Malignant neoplasm of prostate: Secondary | ICD-10-CM

## 2024-06-08 NOTE — Patient Instructions (Signed)

## 2024-06-08 NOTE — Progress Notes (Signed)
   06/08/2024 3:42 PM   Edwin [name] 05-10-1951 [id number]  Reason for visit: Follow up prostate cancer treated with HIFU, ED  HPI: Prior patient of Dr. Gala, transferred care to us  in January 2024 after his retirement. He was originally diagnosed with low risk prostate cancer in 2021 with a baseline PSA 5.7, PSA increased to 6.8 in July 2022 and MRI showed two PI-RADS 4 lesions on the left side.  Repeat fusion guided biopsy showed increased volume of low risk disease in addition to new 3+4=7 disease in the ROI.  He opted for HIFU, and this was completed on 01/15/2022 by Dr. Kassie.   PSA was 2.7 in July 2023, 3.0 in October 2023, 2.8 in January 2024, 3.1 in May 2024, 3.07 November 2023, and 4.0 in June 2025.  We reviewed the challenges of interpreting PSA trend after HIFU, and importance of monitoring the PSA closely, with consideration of prostate MRI or repeat biopsy.  Will use [place] criteria of nadir+2 as indicator of possible biochemical recurrence, and I recommended considering a prostate MRI if PSA rises above 4.7.  He is in agreement.  He has mild urinary symptoms of frequency and weak stream, not bothersome enough to consider medications.  Nocturia x 2.  Drinks 4-5 beers daily.  Also has mild ED, also not interested in medications at this time.  6 months PSA lab visit, call with results Prostate MRI if PSA rises above 4.7   [name], MD  [facility] [address], [address] [place], [place] [address] [phone]

## 2024-07-20 ENCOUNTER — Other Ambulatory Visit

## 2025-01-18 ENCOUNTER — Other Ambulatory Visit
